# Patient Record
Sex: Female | Born: 1995 | Race: Black or African American | Hispanic: No | Marital: Single | Smoking: Never smoker
Health system: Southern US, Community
[De-identification: ages and names within clinical notes are randomized; demographics above are authoritative.]

## PROBLEM LIST (undated history)

## (undated) ENCOUNTER — Inpatient Hospital Stay (HOSPITAL_COMMUNITY): Payer: Self-pay

## (undated) DIAGNOSIS — R7989 Other specified abnormal findings of blood chemistry: Secondary | ICD-10-CM

## (undated) DIAGNOSIS — J45909 Unspecified asthma, uncomplicated: Secondary | ICD-10-CM

## (undated) HISTORY — DX: Unspecified asthma, uncomplicated: J45.909

## (undated) HISTORY — PX: INSERTION OF IMPLANON ROD: OBO 1005

## (undated) HISTORY — PX: REMOVAL OF IMPLANON ROD: OBO 1006

## (undated) HISTORY — PX: WISDOM TOOTH EXTRACTION: SHX21

## (undated) HISTORY — PX: DILATION AND EVACUATION: SHX1459

---
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
## 2018-07-27 NOTE — L&D Delivery Note (Addendum)
OB/GYN Faculty Practice Delivery Note  Deborah Rice is a 23 y.o. [id number] s/p uncomplicated at [redacted]w[redacted]d. She was admitted for active labor.  ROM: AROM with clear fluid immediately prior to delivery GBS Status: negative Maximum Maternal Temperature: 98.22F  Delivery Date/Time: 03/01/2019 @2040  Delivery: Called to room and patient was complete and pushing. Head delivered LOA. Loose nuchal cord present x1, delivered through. Shoulder and body delivered in usual fashion. Infant with spontaneous cry, placed on mother's abdomen, dried and stimulated. Cord clamped x 2 after 1-minute delay, and cut by FOB. Cord blood drawn. Placenta delivered spontaneously with gentle cord traction. Fundus firm with massage and Pitocin. Labia, perineum, vagina, and cervix inspected inspected with no lacerations noted.   Placenta: spontaneous, intact, 3v cord Complications: none Lacerations: none EBL: 100cc, QBL pending Analgesia: none  Postpartum Planning [x]  message to sent to schedule follow-up  [x]  vaccines UTD [ ]  post partum IUD and inpt circ  Infant: vigorous female  APGARs 9/9  weight pending  Corliss Blacker, PGY-III Family Medicine   Attestation:  I confirm that I have verified the information documented in the resident's note and that I have also personally reperformed the physical exam and all medical decision making activities.   I was gloved and present for entire delivery SVD without incident No difficulty with shoulders No lacerations  Seabron Spates, CNM  Please schedule this patient for Postpartum visit in: 4 weeks with the following provider: Any provider For C/S patients schedule nurse incision check in weeks 2 weeks: no Low risk pregnancy complicated by: none Delivery mode:  SVD Anticipated Birth Control:  BTL done PP PP Procedures needed: Recheck TSH  Schedule Integrated [place] visit: no

## 2018-08-09 ENCOUNTER — Other Ambulatory Visit (INDEPENDENT_AMBULATORY_CARE_PROVIDER_SITE_OTHER): Payer: Medicaid Other

## 2018-08-09 VITALS — BP 123/70 | HR 101

## 2018-08-09 DIAGNOSIS — Z3201 Encounter for pregnancy test, result positive: Secondary | ICD-10-CM

## 2018-08-09 LAB — POCT URINE PREGNANCY: Preg Test, Ur: POSITIVE — AB

## 2018-08-09 NOTE — Progress Notes (Signed)
Pt here today for UPT. Had a positive UPT at home. LMP 05/28/18. Pt denies any complaints at this time.   UPT today is positive. Per LMP pt is 10 weeks 2 days. Has new OB appointment on 08/23/2018 and is already taking PNV.   Crosby Oyster, RN

## 2018-08-10 NOTE — Progress Notes (Signed)
I have reviewed the chart and agree with nursing staff's documentation of this patient's encounter.  Aletha Halim, MD 08/10/2018 9:54 AM

## 2018-08-17 ENCOUNTER — Encounter: Payer: Self-pay | Admitting: Radiology

## 2018-08-23 ENCOUNTER — Ambulatory Visit (INDEPENDENT_AMBULATORY_CARE_PROVIDER_SITE_OTHER): Payer: Medicaid Other | Admitting: Obstetrics and Gynecology

## 2018-08-23 ENCOUNTER — Encounter: Payer: Self-pay | Admitting: Obstetrics and Gynecology

## 2018-08-23 DIAGNOSIS — Z3481 Encounter for supervision of other normal pregnancy, first trimester: Secondary | ICD-10-CM | POA: Diagnosis not present

## 2018-08-23 DIAGNOSIS — Z3492 Encounter for supervision of normal pregnancy, unspecified, second trimester: Secondary | ICD-10-CM

## 2018-08-23 DIAGNOSIS — O99210 Obesity complicating pregnancy, unspecified trimester: Secondary | ICD-10-CM | POA: Insufficient documentation

## 2018-08-23 DIAGNOSIS — Z3401 Encounter for supervision of normal first pregnancy, first trimester: Secondary | ICD-10-CM

## 2018-08-23 DIAGNOSIS — Z3480 Encounter for supervision of other normal pregnancy, unspecified trimester: Secondary | ICD-10-CM | POA: Insufficient documentation

## 2018-08-23 DIAGNOSIS — O99211 Obesity complicating pregnancy, first trimester: Secondary | ICD-10-CM

## 2018-08-23 DIAGNOSIS — E669 Obesity, unspecified: Secondary | ICD-10-CM | POA: Insufficient documentation

## 2018-08-23 NOTE — Patient Instructions (Addendum)
Your Due date is August 8th  Today you are 12 weeks and 3 days

## 2018-08-23 NOTE — Progress Notes (Signed)
Last pap 03/2017  Decline flu injection

## 2018-08-23 NOTE — Progress Notes (Signed)
New OB Note  08/23/2018   Clinic: [facility]  Chief Complaint: NOB  Transfer of Care Patient: no  History of Present Illness: Deborah Rice is a 23 y.o. G2P1001 @ 12/3 weeks (EDC LMP=12wk in office u/s today. Patient's last menstrual period was 05/28/2018 (exact date).  Preg complicated by has Supervision of normal pregnancy in first trimester; BMI 30s; and Obesity in pregnancy on their problem list.   Any events prior to today's visit: no Her periods were: qmonth, regular She was using no method when she conceived.  She has Negative signs or symptoms of nausea/vomiting of pregnancy. She has Negative signs or symptoms of miscarriage or preterm labor On any medications around the time she conceived/early pregnancy: No   ROS: A 12-point review of systems was performed and negative, except as stated in the above HPI.  OBGYN History: As per HPI. OB History  Gravida Para Term Preterm AB Living  2 1 1     1   SAB TAB Ectopic Multiple Live Births          1    # Outcome Date GA Lbr Len/2nd Weight Sex Delivery Anes PTL Lv  2 Current           1 Term 07/28/15   7 lb (3.175 kg) M Vag-Spont  N LIV    Any issues with any prior pregnancies: no Prior children are healthy, doing well, and without any problems or issues: yes History of pap smears: Yes. Last pap smear 2018 and results were NILM    Past Medical History: Past Medical History:  Diagnosis Date  . Asthma     Past Surgical History: Past Surgical History:  Procedure Laterality Date  . WISDOM TOOTH EXTRACTION  2014    Family History:  Family History  Problem Relation Age of Onset  . Asthma Sister   . Asthma Brother   . Diabetes Maternal Grandmother     She denies any history of mental retardation, birth defects or genetic disorders in her or the FOB's history  Social History:  Social History   Socioeconomic History  . Marital status: Single    Spouse name: Not on file  . Number of  children: Not on file  . Years of education: Not on file  . Highest education level: Not on file  Occupational History  . Not on file  Social Needs  . Financial resource strain: Not on file  . Food insecurity:    Worry: Not on file    Inability: Not on file  . Transportation needs:    Medical: Not on file    Non-medical: Not on file  Tobacco Use  . Smoking status: Never Smoker  . Smokeless tobacco: Never Used  Substance and Sexual Activity  . Alcohol use: Not Currently  . Drug use: Not Currently  . Sexual activity: Yes  Lifestyle  . Physical activity:    Days per week: Not on file    Minutes per session: Not on file  . Stress: Not on file  Relationships  . Social connections:    Talks on phone: Not on file    Gets together: Not on file    Attends religious service: Not on file    Active member of club or organization: Not on file    Attends meetings of clubs or organizations: Not on file    Relationship status: Not on file  . Intimate partner violence:    Fear of current or ex  partner: Not on file    Emotionally abused: Not on file    Physically abused: Not on file    Forced sexual activity: Not on file  Other Topics Concern  . Not on file  Social History Narrative  . Not on file     Allergy: Not on File  Health Maintenance:  Mammogram Up to Date: not applicable  Current Outpatient Medications: PNV  Physical Exam:   BP (!) 104/58   Pulse 77   Ht 5\' 3"  (1.6 m)   Wt 193 lb 3.2 oz (87.6 kg)   LMP 05/28/2018 (Exact Date)   BMI 34.22 kg/m  Body mass index is 34.22 kg/m.    Marland Kitchen Fundal height: not applicable FHTs: 903E  General appearance: Well nourished, well developed female in no acute distress.  Neck:  Supple, normal appearance, and no thyromegaly  Cardiovascular: S1, S2 normal, no murmur, rub or gallop, regular rate and rhythm Respiratory:  Clear to auscultation bilateral. Normal respiratory effort Abdomen: positive bowel sounds and no masses,  hernias; diffusely non tender to palpation, non distended Breasts: pt denies any breast s/s Neuro/Psych:  Normal mood and affect.  Skin:  Warm and dry.   Laboratory: none  Imaging:  Bedside u/s with SLIUP at 11/6 based on CRL, FHR 160s, with subj normal AF and +FM  Assessment: pt doing well  Plan: 1. Encounter for supervision of normal first pregnancy in first trimester Offer afp and schedule anatomy u/s next visit.  - Obstetric Panel, Including HIV - Inheritest(R) CF/SMA Panel - Culture, OB Urine - Hemoglobinopathy evaluation - Genetic Screening - Enroll Patient in Babyscripts - TSH - Hemoglobin A1c - Comprehensive metabolic panel - Protein / creatinine ratio, urine  2. BMI 30s  3. Obesity in pregnancy  Problem list reviewed and updated.  Follow up in 3 weeks.  The nature of [place] with multiple MDs and other Advanced Practice Providers was explained to patient; also emphasized that residents, students are part of our team.  >50% of 25 min visit spent on counseling and coordination of care.     Durene Romans MD Attending [facility])

## 2018-08-24 LAB — PROTEIN / CREATININE RATIO, URINE
Creatinine, Urine: 393.2 mg/dL
Protein, Ur: 62.4 mg/dL
Protein/Creat Ratio: 159 mg/g creat (ref 0–200)

## 2018-08-25 LAB — URINE CULTURE, OB REFLEX: ORGANISM ID, BACTERIA: NO GROWTH

## 2018-08-25 LAB — CULTURE, OB URINE

## 2018-08-29 ENCOUNTER — Encounter: Payer: Self-pay | Admitting: Radiology

## 2018-08-30 ENCOUNTER — Other Ambulatory Visit: Payer: Self-pay | Admitting: *Deleted

## 2018-08-30 MED ORDER — PREPLUS 27-1 MG PO TABS: 1.0000 | ORAL_TABLET | Freq: Every day | ORAL | 13 refills | Status: DC

## 2018-09-02 ENCOUNTER — Encounter: Payer: Self-pay | Admitting: Radiology

## 2018-09-05 LAB — OBSTETRIC PANEL, INCLUDING HIV
Antibody Screen: NEGATIVE
Basophils Absolute: 0 10*3/uL (ref 0.0–0.2)
Basos: 0 %
EOS (ABSOLUTE): 0 10*3/uL (ref 0.0–0.4)
Eos: 0 %
HIV Screen 4th Generation wRfx: NONREACTIVE
Hematocrit: 36.3 % (ref 34.0–46.6)
Hemoglobin: 12.5 g/dL (ref 11.1–15.9)
Hepatitis B Surface Ag: NEGATIVE
Immature Grans (Abs): 0 10*3/uL (ref 0.0–0.1)
Immature Granulocytes: 0 %
Lymphocytes Absolute: 1.7 10*3/uL (ref 0.7–3.1)
Lymphs: 19 %
MCH: 28.5 pg (ref 26.6–33.0)
MCHC: 34.4 g/dL (ref 31.5–35.7)
MCV: 83 fL (ref 79–97)
Monocytes Absolute: 0.5 10*3/uL (ref 0.1–0.9)
Monocytes: 5 %
Neutrophils Absolute: 6.5 10*3/uL (ref 1.4–7.0)
Neutrophils: 76 %
Platelets: 238 10*3/uL (ref 150–450)
RBC: 4.38 x10E6/uL (ref 3.77–5.28)
RDW: 13.3 % (ref 11.7–15.4)
RPR Ser Ql: NONREACTIVE
Rh Factor: POSITIVE
Rubella Antibodies, IGG: 2.04 index (ref >0.99)
WBC: 8.8 10*3/uL (ref 3.4–10.8)

## 2018-09-05 LAB — HEMOGLOBINOPATHY EVALUATION
HGB A: 97.6 % (ref 96.4–98.8)
HGB C: 0 %
HGB S: 0 %
HGB VARIANT: 0 %
Hemoglobin A2 Quantitation: 2.4 % (ref 1.8–3.2)
Hemoglobin F Quantitation: 0 % (ref 0.0–2.0)

## 2018-09-05 LAB — INHERITEST(R) CF/SMA PANEL

## 2018-09-05 LAB — COMPREHENSIVE METABOLIC PANEL
ALT: 10 IU/L (ref 0–32)
AST: 10 IU/L (ref 0–40)
Albumin/Globulin Ratio: 1.8 (ref 1.2–2.2)
Albumin: 4.2 g/dL (ref 3.9–5.0)
Alkaline Phosphatase: 48 IU/L (ref 39–117)
BUN/Creatinine Ratio: 11 (ref 9–23)
BUN: 7 mg/dL (ref 6–20)
Bilirubin Total: 0.5 mg/dL (ref 0.0–1.2)
CALCIUM: 9.6 mg/dL (ref 8.7–10.2)
CO2: 18 mmol/L — AB (ref 20–29)
Chloride: 105 mmol/L (ref 96–106)
Creatinine, Ser: 0.63 mg/dL (ref 0.57–1.00)
GFR calc Af Amer: 146 mL/min/{1.73_m2} (ref >59)
GFR calc non Af Amer: 127 mL/min/{1.73_m2} (ref >59)
Globulin, Total: 2.3 g/dL (ref 1.5–4.5)
Glucose: 93 mg/dL (ref 65–99)
Potassium: 3.9 mmol/L (ref 3.5–5.2)
Sodium: 138 mmol/L (ref 134–144)
Total Protein: 6.5 g/dL (ref 6.0–8.5)

## 2018-09-05 LAB — HEMOGLOBIN A1C
Est. average glucose Bld gHb Est-mCnc: 94 mg/dL
Hgb A1c MFr Bld: 4.9 % (ref 4.8–5.6)

## 2018-09-05 LAB — TSH: TSH: 0.426 u[IU]/mL — ABNORMAL LOW (ref 0.450–4.500)

## 2018-09-10 ENCOUNTER — Encounter: Payer: Self-pay | Admitting: Obstetrics and Gynecology

## 2018-09-10 DIAGNOSIS — R7989 Other specified abnormal findings of blood chemistry: Secondary | ICD-10-CM

## 2018-09-10 HISTORY — DX: Other specified abnormal findings of blood chemistry: R79.89

## 2018-09-13 ENCOUNTER — Other Ambulatory Visit (HOSPITAL_COMMUNITY)
Admission: RE | Admit: 2018-09-13 | Discharge: 2018-09-13 | Disposition: A | Discharge status: Home / Self Care | Payer: Medicaid Other | Source: Ambulatory Visit | Attending: Obstetrics & Gynecology | Admitting: Obstetrics & Gynecology

## 2018-09-13 ENCOUNTER — Ambulatory Visit (INDEPENDENT_AMBULATORY_CARE_PROVIDER_SITE_OTHER): Payer: Medicaid Other | Admitting: Obstetrics & Gynecology

## 2018-09-13 VITALS — BP 111/72 | HR 72 | Wt 193.0 lb

## 2018-09-13 DIAGNOSIS — O4692 Antepartum hemorrhage, unspecified, second trimester: Secondary | ICD-10-CM

## 2018-09-13 DIAGNOSIS — R7989 Other specified abnormal findings of blood chemistry: Secondary | ICD-10-CM

## 2018-09-13 DIAGNOSIS — Z3482 Encounter for supervision of other normal pregnancy, second trimester: Secondary | ICD-10-CM

## 2018-09-13 DIAGNOSIS — Z3689 Encounter for other specified antenatal screening: Secondary | ICD-10-CM

## 2018-09-13 DIAGNOSIS — N760 Acute vaginitis: Secondary | ICD-10-CM

## 2018-09-13 DIAGNOSIS — Z3A15 15 weeks gestation of pregnancy: Secondary | ICD-10-CM

## 2018-09-13 DIAGNOSIS — O23592 Infection of other part of genital tract in pregnancy, second trimester: Secondary | ICD-10-CM

## 2018-09-13 NOTE — Progress Notes (Signed)
   PRENATAL VISIT NOTE  Subjective:  Deborah Rice is a 23 y.o. G2P1001 at [redacted]w[redacted]d being seen today for ongoing prenatal care.  She is currently monitored for the following issues for this low-risk pregnancy and has Supervision of normal pregnancy in second trimester; BMI 30s; Obesity in pregnancy; and Low TSH level on their problem list.  Patient reports episodes of vaginal spotting. Not related to intercourse or other activity. No previous ultrasounds.  Contractions: Not present. Vag. Bleeding: None.   . Denies leaking of fluid.   The following portions of the patient's history were reviewed and updated as appropriate: allergies, current medications, past family history, past medical history, past social history, past surgical history and problem list. Problem list updated.  Objective:   Vitals:   09/13/18 1313  BP: 111/72  Pulse: 72  Weight: 193 lb (87.5 kg)    Fetal Status: Fetal Heart Rate (bpm): 150         General:  Alert, oriented and cooperative. Patient is in no acute distress.  Skin: Skin is warm and dry. No rash noted.   Cardiovascular: Normal heart rate noted  Respiratory: Normal respiratory effort, no problems with respiration noted  Abdomen: Soft, gravid, appropriate for gestational age.  Pain/Pressure: Present     Pelvic: Cervical exam performed Dilation: Closed Effacement (%): Thick Station: Ballotable. White discharge on exam, testing sample obtained.  Extremities: Normal range of motion.     Mental Status: Normal mood and affect. Normal behavior. Normal judgment and thought content.   Assessment and Plan:  Pregnancy: G2P1001 at [redacted]w[redacted]d  1. Vaginal bleeding in pregnancy, second trimester Unremarkable exam findings. Given second trimester bleeding, will evaluate for placental (SCH/previa) or cervical anomaly. Bleeding precautions reviewed.  - [place] MFM OB LIMITED; Future - [place] MFM OB TRANSVAGINAL; Future - Cervicovaginal ancillary only  2. Low TSH level TSH was 0.426  on 08/23/18, no history of thyroid dysfunction - T4, free - T3, free  3. Encounter for fetal anatomic survey Anatomy scan ordered.  - [place] MFM OB COMP + 14 WK; Future  4. Encounter for supervision of other normal pregnancy in second trimester Had low risk NIPS, AFP to be done today.  - AFP, Serum, Open Spina Bifida No other complaints or concerns.  Routine obstetric precautions reviewed. Please refer to After Visit Summary for other counseling recommendations.  Return in about 4 weeks (around 10/11/2018) for OB Visit with CNM Jeronimo Greaves).  Future Appointments  Date Time Provider [place]  09/14/2018 10:00 AM WH-MFC [place] 3 WH-MFCUS MFC-US  10/12/2018 10:00 AM WH-MFC [place] 3 WH-MFCUS MFC-US    Verita Schneiders, MD

## 2018-09-13 NOTE — Patient Instructions (Signed)
Considering Waterbirth? Guide for patients at [facility] [employer] [facility]  Why consider waterbirth?  . Gentle birth for babies . Less pain medicine used in labor . May allow for passive descent/less pushing . May reduce perineal tears  . More mobility and instinctive maternal position changes . Increased maternal relaxation . Reduced blood pressure in labor  Is waterbirth safe? What are the risks of infection, drowning or other complications?  . Infection: o Very low risk (3.7 % for tub vs 4.8% for bed) o 7 in 8000 waterbirths with documented infection o Poorly cleaned equipment most common cause o Slightly lower group B strep transmission rate  . Drowning o Maternal:  - Very low risk   - Related to seizures or fainting o Newborn:  - Very low risk. No evidence of increased risk of respiratory problems in multiple large studies - Physiological protection from breathing under water - Avoid underwater birth if there are any fetal complications - Once baby's head is out of the water, keep it out.  . Birth complication o Some reports of cord trauma, but risk decreased by bringing baby to surface gradually o No evidence of increased risk of shoulder dystocia. Mothers can usually change positions faster in water than in a bed, possibly aiding the maneuvers to free the shoulder.   You must attend a Doren Custard class at [facility]  3rd Wednesday of every month from 7-9pm  [employer] by calling [phone] or online at [email or web address]  Bring [place] the certificate from the class to your prenatal appointment  Meet with a midwife at 36 weeks to see if you can still plan a waterbirth and to sign the consent.   If you plan a waterbirth after September 18, 2018, at [facility], you will need to purchase the following:  Fish net  Bathing suit top (optional)  Long-handled mirror (optional)  If you plan a waterbirth before  September 18, 2018: Purchase or rent the following supplies: You are responsible for providing all supplies listed above. **If you do not have all necessary supplies you cannot have a waterbirth.**   Water Birth Pool (Birth Pool in a Box or [place] for instance)  (Tubs start ~$125)  Single-use disposable tub liner designed for your brand of tub  Electric drain pump to remove water (We recommend 792 gallon per hour or greater pump.)   New garden hose labeled "lead-free", "suitable for drinking water",  Separate garden hose to remove the dirty water  Fish net  Bathing suit top (optional)  Long-handled mirror (optional)  Places to purchase or rent supplies:   [employer] for tub purchases and supplies  [employer] for tub purchases and supplies  [employer] (www.thelaborladies.com) $275 for tub rental/set-up & take down/kit   [employer] (http://www.fleming.com/.htm) Information regarding doulas (labor support) who provide pool rentals  Things that would prevent you from having a waterbirth:  Premature, <37wks  Previous cesarean birth  Presence of thick meconium-stained fluid  Multiple gestation (Twins, triplets, etc.)  Uncontrolled diabetes or gestational diabetes requiring medication  Hypertension requiring medication or diagnosis of pre-eclampsia  Heavy vaginal bleeding  Non-reassuring fetal heart rate  Active infection (MRSA, etc.). Group B Strep is NOT a contraindication for waterbirth.  If your labor has to be induced and induction method requires continuous monitoring of the baby's heart rate  Other risks/issues identified by your obstetrical provider  Please remember that birth is unpredictable. Under certain unforeseeable circumstances your  provider may advise against giving birth in the tub. These decisions will be made on a case-by-case basis and with the safety of you and your baby as our highest  priority.    

## 2018-09-14 ENCOUNTER — Ambulatory Visit (HOSPITAL_COMMUNITY)
Admission: RE | Admit: 2018-09-14 | Discharge: 2018-09-14 | Disposition: A | Discharge status: Home / Self Care | Payer: Medicaid Other | Source: Ambulatory Visit | Attending: Obstetrics & Gynecology | Admitting: Obstetrics & Gynecology

## 2018-09-14 DIAGNOSIS — O4692 Antepartum hemorrhage, unspecified, second trimester: Secondary | ICD-10-CM | POA: Diagnosis not present

## 2018-09-14 DIAGNOSIS — Z3A15 15 weeks gestation of pregnancy: Secondary | ICD-10-CM | POA: Diagnosis not present

## 2018-09-14 LAB — CERVICOVAGINAL ANCILLARY ONLY
Bacterial vaginitis: NEGATIVE
CHLAMYDIA, DNA PROBE: NEGATIVE
Candida vaginitis: POSITIVE — AB
Neisseria Gonorrhea: NEGATIVE
Trichomonas: NEGATIVE

## 2018-09-14 LAB — T3, FREE: T3, Free: 2.9 pg/mL (ref 2.0–4.4)

## 2018-09-14 LAB — T4, FREE: Free T4: 1.56 ng/dL (ref 0.82–1.77)

## 2018-09-14 MED ORDER — TERCONAZOLE 0.8 % VA CREA: 1.0000 | TOPICAL_CREAM | Freq: Every day | VAGINAL | 0 refills | Status: DC

## 2018-09-14 NOTE — Addendum Note (Signed)
Addended by: Verita Schneiders A on: 09/14/2018 04:56 PM   Modules accepted: Orders

## 2018-09-15 LAB — AFP, SERUM, OPEN SPINA BIFIDA
AFP MoM: 0.52
AFP VALUE AFPOSL: 14.9 ng/mL
Gest. Age on Collection Date: 15.4 weeks
Maternal Age At EDD: 23.9 yr
OSBR Risk 1 IN: 10000
Test Results:: NEGATIVE
WEIGHT: 193 [lb_av]

## 2018-09-16 ENCOUNTER — Encounter: Payer: Self-pay | Admitting: Obstetrics & Gynecology

## 2018-09-16 DIAGNOSIS — O4692 Antepartum hemorrhage, unspecified, second trimester: Secondary | ICD-10-CM | POA: Insufficient documentation

## 2018-10-12 ENCOUNTER — Ambulatory Visit (INDEPENDENT_AMBULATORY_CARE_PROVIDER_SITE_OTHER): Payer: Medicaid Other | Admitting: Advanced Practice Midwife

## 2018-10-12 ENCOUNTER — Other Ambulatory Visit: Payer: Self-pay

## 2018-10-12 ENCOUNTER — Ambulatory Visit (HOSPITAL_COMMUNITY)
Admission: RE | Admit: 2018-10-12 | Discharge: 2018-10-12 | Disposition: A | Discharge status: Home / Self Care | Payer: Medicaid Other | Source: Ambulatory Visit | Attending: Obstetrics & Gynecology | Admitting: Obstetrics & Gynecology

## 2018-10-12 VITALS — BP 106/69 | HR 59 | Wt 193.0 lb

## 2018-10-12 DIAGNOSIS — O99210 Obesity complicating pregnancy, unspecified trimester: Secondary | ICD-10-CM

## 2018-10-12 DIAGNOSIS — Z363 Encounter for antenatal screening for malformations: Secondary | ICD-10-CM

## 2018-10-12 DIAGNOSIS — Z3A19 19 weeks gestation of pregnancy: Secondary | ICD-10-CM | POA: Diagnosis not present

## 2018-10-12 DIAGNOSIS — O99212 Obesity complicating pregnancy, second trimester: Secondary | ICD-10-CM

## 2018-10-12 DIAGNOSIS — Z3482 Encounter for supervision of other normal pregnancy, second trimester: Secondary | ICD-10-CM

## 2018-10-12 DIAGNOSIS — Z3689 Encounter for other specified antenatal screening: Secondary | ICD-10-CM | POA: Diagnosis not present

## 2018-10-12 DIAGNOSIS — O4692 Antepartum hemorrhage, unspecified, second trimester: Secondary | ICD-10-CM

## 2018-10-12 NOTE — Patient Instructions (Signed)

## 2018-10-12 NOTE — Progress Notes (Signed)
   PRENATAL VISIT NOTE  Subjective:  Deborah Rice is a 23 y.o. G2P1001 at [redacted]w[redacted]d being seen today for ongoing prenatal care.  She is currently monitored for the following issues for this low-risk pregnancy and has Supervision of normal pregnancy in second trimester; BMI 30s; Obesity in pregnancy; Low TSH level; and Vaginal bleeding in pregnancy, second trimester on their problem list.  Patient reports no complaints. Her previous vaginal spotting resolved "almost right after" her previous visit without intervention.  Contractions: Not present. Vag. Bleeding: None.  Movement: Present. Denies leaking of fluid.   The following portions of the patient's history were reviewed and updated as appropriate: allergies, current medications, past family history, past medical history, past social history, past surgical history and problem list. Problem list updated.  Objective:   Vitals:   10/12/18 1312  BP: 106/69  Pulse: (!) 59  Weight: 193 lb (87.5 kg)    Fetal Status: Fetal Heart Rate (bpm): 152   Movement: Present     General:  Alert, oriented and cooperative. Patient is in no acute distress.  Skin: Skin is warm and dry. No rash noted.   Cardiovascular: Normal heart rate noted  Respiratory: Normal respiratory effort, no problems with respiration noted  Abdomen: Soft, gravid, appropriate for gestational age.  Pain/Pressure: Present     Pelvic: Cervical exam deferred        Extremities: Normal range of motion.  Edema: None  Mental Status: Normal mood and affect. Normal behavior. Normal judgment and thought content.   Assessment and Plan:  Pregnancy: G2P1001 at [redacted]w[redacted]d  1. Encounter for supervision of other normal pregnancy in second trimester --No complaints or concerns  2. Vaginal bleeding in pregnancy, second trimester --S/p normal ultrasound this morning --No bleeding since previous visit  3. Obesity in pregnancy --Weight stable  Preterm labor symptoms and general obstetric  precautions including but not limited to vaginal bleeding, contractions, leaking of fluid and fetal movement were reviewed in detail with the patient. Please refer to After Visit Summary for other counseling recommendations.  Return in about 4 weeks (around 11/09/2018).  Future Appointments  Date Time Provider [place]  10/26/2018  1:15 PM Caren Macadam, MD [facility]    Darlina Rumpf, [place]

## 2018-10-26 ENCOUNTER — Encounter: Payer: Self-pay | Admitting: Family Medicine

## 2018-10-26 ENCOUNTER — Other Ambulatory Visit: Payer: Self-pay

## 2018-10-26 ENCOUNTER — Ambulatory Visit (INDEPENDENT_AMBULATORY_CARE_PROVIDER_SITE_OTHER): Payer: Medicaid Other | Admitting: Family Medicine

## 2018-10-26 ENCOUNTER — Other Ambulatory Visit (HOSPITAL_COMMUNITY)
Admission: RE | Admit: 2018-10-26 | Discharge: 2018-10-26 | Disposition: A | Discharge status: Home / Self Care | Payer: Medicaid Other | Source: Ambulatory Visit | Attending: Family Medicine | Admitting: Family Medicine

## 2018-10-26 VITALS — BP 124/75 | HR 101 | Wt 196.0 lb

## 2018-10-26 DIAGNOSIS — N898 Other specified noninflammatory disorders of vagina: Secondary | ICD-10-CM | POA: Diagnosis not present

## 2018-10-26 DIAGNOSIS — O26892 Other specified pregnancy related conditions, second trimester: Secondary | ICD-10-CM | POA: Diagnosis not present

## 2018-10-26 DIAGNOSIS — Z3482 Encounter for supervision of other normal pregnancy, second trimester: Secondary | ICD-10-CM

## 2018-10-26 DIAGNOSIS — Z3A21 21 weeks gestation of pregnancy: Secondary | ICD-10-CM

## 2018-10-26 DIAGNOSIS — O99210 Obesity complicating pregnancy, unspecified trimester: Secondary | ICD-10-CM

## 2018-10-26 DIAGNOSIS — O99212 Obesity complicating pregnancy, second trimester: Secondary | ICD-10-CM

## 2018-10-26 DIAGNOSIS — O4692 Antepartum hemorrhage, unspecified, second trimester: Secondary | ICD-10-CM

## 2018-10-26 NOTE — Progress Notes (Signed)
   PRENATAL VISIT NOTE  Subjective:  Deborah Rice is a 23 y.o. G2P1001 at [redacted]w[redacted]d being seen today for ongoing prenatal care.  She is currently monitored for the following issues for this low-risk pregnancy and has Supervision of normal pregnancy in second trimester; BMI 30s; Obesity in pregnancy; Low TSH level; and Vaginal bleeding in pregnancy, second trimester on their problem list.  Patient reports see below.  Contractions: Irritability. Vag. Bleeding: Other.  Movement: (!) Decreased. Denies leaking of fluid.    Yesterday she reports like she felt like something was coming out. Report thick/yellow green mucous. She thought it looked like a mucous plug which she remembers coming out at ~29 weks with her last pregnancy.  Reports associated lower abdominal pain. No itching/ burning. Occurred yesterday. Since that time reports pain located in LLQ, intermittently every 4 x per day, more intense. Denies neuropathic sx.  Reports last BM was this morning.   The following portions of the patient's history were reviewed and updated as appropriate: allergies, current medications, past family history, past medical history, past social history, past surgical history and problem list.   Objective:   Vitals:   10/26/18 1426  BP: 124/75  Pulse: (!) 101  Weight: 196 lb (88.9 kg)    Fetal Status: Fetal Heart Rate (bpm): 150   Movement: (!) Decreased     General:  Alert, oriented and cooperative. Patient is in no acute distress.  Skin: Skin is warm and dry. No rash noted.   Cardiovascular: Normal heart rate noted  Respiratory: Normal respiratory effort, no problems with respiration noted  Abdomen: Soft, gravid, appropriate for gestational age.  Pain/Pressure: Present     Pelvic: Cervical exam performed Dilation: Closed Effacement (%): Thick  + thickwhite/yellow discharge. No pooling.   Extremities: Normal range of motion.  Edema: None  Mental Status: Normal mood and affect. Normal behavior. Normal  judgment and thought content.   Assessment and Plan:  Pregnancy: G2P1001 at [redacted]w[redacted]d  1. Encounter for supervision of other normal pregnancy in second trimester Reassuring exam today- nitrazine negative, not concerned for ROM. Cervix is visually closed and closed on exam.  Counseled to seek care if cramping continued or has bleeding. Most likely normal discharge with ligament (round vs broad) pain. She voiced understanding.   2. Obesity in pregnancy TWG= -24 lb (-10.9 kg)   3. Vaginal bleeding in pregnancy, second trimester None reported today  4. Vaginal discharge during pregnancy in second trimester - Cervicovaginal ancillary only   Preterm labor symptoms and general obstetric precautions including but not limited to vaginal bleeding, contractions, leaking of fluid and fetal movement were reviewed in detail with the patient. Please refer to After Visit Summary for other counseling recommendations.   Return in about 4 weeks (around 11/23/2018) for Routine prenatal care *VIRTUAL*.  Future Appointments  Date Time Provider [place]  11/23/2018  1:30 PM Caren Macadam, MD [facility]    Caren Macadam, MD

## 2018-10-28 LAB — CERVICOVAGINAL ANCILLARY ONLY
Bacterial vaginitis: NEGATIVE
Candida vaginitis: POSITIVE — AB
Chlamydia: NEGATIVE
Neisseria Gonorrhea: NEGATIVE
Trichomonas: NEGATIVE

## 2018-10-31 ENCOUNTER — Other Ambulatory Visit: Payer: Self-pay

## 2018-10-31 ENCOUNTER — Telehealth: Payer: Self-pay

## 2018-10-31 MED ORDER — FLUCONAZOLE 150 MG PO TABS: 150.0000 mg | ORAL_TABLET | Freq: Once | ORAL | 0 refills | Status: AC

## 2018-10-31 NOTE — Telephone Encounter (Signed)
Called in diflucan 150 mg  for patient who tested positive for yeast.

## 2018-10-31 NOTE — Telephone Encounter (Signed)
-----   Message from Caren Macadam, MD sent at 10/31/2018 10:09 AM EDT ----- Positive for yeast. Please call patient and see if she is still having the thick discharge and if so treat for yeast per the standing order. Thanks!

## 2018-10-31 NOTE — Telephone Encounter (Signed)
Patient tested positive for yeast infection. I have called her to inform her of test results and the need to be treated if she is still having discharge. LMCTB

## 2018-11-08 ENCOUNTER — Encounter: Payer: Self-pay | Admitting: *Deleted

## 2018-11-14 ENCOUNTER — Encounter: Payer: Self-pay | Admitting: *Deleted

## 2018-11-14 ENCOUNTER — Telehealth: Payer: Self-pay | Admitting: *Deleted

## 2018-11-14 NOTE — Telephone Encounter (Signed)
Left message for pt to call back to reschedule her appointment.

## 2018-11-22 ENCOUNTER — Other Ambulatory Visit: Payer: Self-pay

## 2018-11-22 ENCOUNTER — Ambulatory Visit (INDEPENDENT_AMBULATORY_CARE_PROVIDER_SITE_OTHER): Payer: Medicaid Other | Admitting: Family Medicine

## 2018-11-22 VITALS — BP 116/72 | Wt 200.0 lb

## 2018-11-22 DIAGNOSIS — K219 Gastro-esophageal reflux disease without esophagitis: Secondary | ICD-10-CM

## 2018-11-22 DIAGNOSIS — Z3A25 25 weeks gestation of pregnancy: Secondary | ICD-10-CM

## 2018-11-22 DIAGNOSIS — Z3482 Encounter for supervision of other normal pregnancy, second trimester: Secondary | ICD-10-CM

## 2018-11-22 MED ORDER — OMEPRAZOLE MAGNESIUM 20 MG PO TBEC: 20.0000 mg | DELAYED_RELEASE_TABLET | Freq: Every day | ORAL | 3 refills | Status: DC

## 2018-11-22 NOTE — Patient Instructions (Signed)

## 2018-11-22 NOTE — Progress Notes (Signed)
   PRENATAL VISIT NOTE TELEHEALTH VIRTUAL OBSTETRICS VISIT ENCOUNTER NOTE  I connected with@ on 11/22/18 at  2:15 PM EDT by Webex at home and verified that I am speaking with the correct person using two identifiers.   I discussed the limitations, risks, security and privacy concerns of performing an evaluation and management service by telephone and the availability of in person appointments. I also discussed with the patient that there may be a patient responsible charge related to this service. The patient expressed understanding and agreed to proceed. Subjective:  Deborah Rice is a 23 y.o. G2P1001 at [redacted]w[redacted]d being seen today for ongoing prenatal care.  She is currently monitored for the following issues for this low-risk pregnancy and has Supervision of normal pregnancy in second trimester; BMI 30s; Obesity in pregnancy; Low TSH level; and Vaginal bleeding in pregnancy, second trimester on their problem list.  Patient reports heartburn.  Reports fetal movement. Contractions: Irritability. Vag. Bleeding: None.  Movement: Present. Denies any contractions, bleeding or leaking of fluid.   The following portions of the patient's history were reviewed and updated as appropriate: allergies, current medications, past family history, past medical history, past social history, past surgical history and problem list.   Objective:   Vitals:   11/22/18 1429  BP: 116/72  Weight: 200 lb (90.7 kg)    Fetal Status:     Movement: Present     General:  Alert, oriented and cooperative. Patient is in no acute distress.  Respiratory: Normal respiratory effort, no problems with respiration noted  Mental Status: Normal mood and affect. Normal behavior. Normal judgment and thought content.  Rest of physical exam deferred due to type of encounter  Assessment and Plan:  Pregnancy: G2P1001 at [redacted]w[redacted]d 1. Encounter for supervision of other normal pregnancy in second trimester Continue routine prenatal care.    2. Gastroesophageal reflux disease without esophagitis Trial of PPI  Preterm labor symptoms and general obstetric precautions including but not limited to vaginal bleeding, contractions, leaking of fluid and fetal movement were reviewed in detail with the patient. I discussed the assessment and treatment plan with the patient. The patient was provided an opportunity to ask questions and all were answered. The patient agreed with the plan and demonstrated an understanding of the instructions. The patient was advised to call back or seek an in-person office evaluation/go to [facility] at [facility] for any urgent or concerning symptoms. Please refer to After Visit Summary for other counseling recommendations.  I provided 10 minutes of non-face-to-face time during this encounter. Return in 3 weeks (on 12/13/2018) for 28 wk labs.  Future Appointments  Date Time Provider [place]  12/13/2018  8:30 AM Aletha Halim, MD [facility]    Donnamae Jude, [place] for [facility], [place]

## 2018-11-23 ENCOUNTER — Encounter: Payer: Medicaid Other | Admitting: Family Medicine

## 2018-12-13 ENCOUNTER — Other Ambulatory Visit: Payer: Self-pay

## 2018-12-13 ENCOUNTER — Ambulatory Visit (INDEPENDENT_AMBULATORY_CARE_PROVIDER_SITE_OTHER): Payer: Medicaid Other | Admitting: Obstetrics and Gynecology

## 2018-12-13 VITALS — BP 123/80 | HR 92 | Wt 208.5 lb

## 2018-12-13 DIAGNOSIS — Z3402 Encounter for supervision of normal first pregnancy, second trimester: Secondary | ICD-10-CM | POA: Diagnosis not present

## 2018-12-13 DIAGNOSIS — R7989 Other specified abnormal findings of blood chemistry: Secondary | ICD-10-CM | POA: Diagnosis not present

## 2018-12-13 DIAGNOSIS — Z23 Encounter for immunization: Secondary | ICD-10-CM

## 2018-12-13 NOTE — Progress Notes (Signed)
Prenatal Visit Note Date: 12/13/2018 Clinic: [facility]  Subjective:  Deborah Rice is a 23 y.o. G2P1001 at [redacted]w[redacted]d being seen today for ongoing prenatal care.  She is currently monitored for the following issues for this low-risk pregnancy and has Supervision of normal pregnancy in second trimester; BMI 30s; Obesity in pregnancy; and Low TSH level on their problem list.  Patient reports no complaints.   Contractions: Not present.  .  Movement: Present. Denies leaking of fluid.   The following portions of the patient's history were reviewed and updated as appropriate: allergies, current medications, past family history, past medical history, past social history, past surgical history and problem list. Problem list updated.  Objective:   Vitals:   12/13/18 0841  BP: 123/80  Pulse: 92  Weight: 208 lb 8 oz (94.6 kg)    Fetal Status: Fetal Heart Rate (bpm): 143 Fundal Height: 28 cm Movement: Present     General:  Alert, oriented and cooperative. Patient is in no acute distress.  Skin: Skin is warm and dry. No rash noted.   Cardiovascular: Normal heart rate noted  Respiratory: Normal respiratory effort, no problems with respiration noted  Abdomen: Soft, gravid, appropriate for gestational age. Pain/Pressure: Absent     Pelvic:  Cervical exam deferred        Extremities: Normal range of motion.  Edema: None  Mental Status: Normal mood and affect. Normal behavior. Normal judgment and thought content.   Urinalysis:      Assessment and Plan:  Pregnancy: G2P1001 at [redacted]w[redacted]d  1. Encounter for supervision of normal first pregnancy in second trimester Routine care - Glucose Tolerance, 2 Hours w/1 Hour - RPR - CBC - HIV Antibody (routine testing w rflx)  2. Low TSH level - Thyroid Panel With TSH  Preterm labor symptoms and general obstetric precautions including but not limited to vaginal bleeding, contractions, leaking of fluid and fetal movement were reviewed in  detail with the patient. Please refer to After Visit Summary for other counseling recommendations.  Return in about 3 weeks (around 01/03/2019) for virtual rob.   Aletha Halim, MD

## 2018-12-13 NOTE — Progress Notes (Signed)
tdap today Baby scripts

## 2018-12-14 LAB — CBC
Hematocrit: 32.4 % — ABNORMAL LOW (ref 34.0–46.6)
Hemoglobin: 11.4 g/dL (ref 11.1–15.9)
MCH: 30 pg (ref 26.6–33.0)
MCHC: 35.2 g/dL (ref 31.5–35.7)
MCV: 85 fL (ref 79–97)
Platelets: 214 10*3/uL (ref 150–450)
RBC: 3.8 x10E6/uL (ref 3.77–5.28)
RDW: 12.5 % (ref 11.7–15.4)
WBC: 12.6 10*3/uL — ABNORMAL HIGH (ref 3.4–10.8)

## 2018-12-14 LAB — THYROID PANEL WITH TSH
Free Thyroxine Index: 1.4 (ref 1.2–4.9)
T3 Uptake Ratio: 12 % — ABNORMAL LOW (ref 24–39)
T4, Total: 11.8 ug/dL (ref 4.5–12.0)
TSH: 2.62 u[IU]/mL (ref 0.450–4.500)

## 2018-12-14 LAB — HIV ANTIBODY (ROUTINE TESTING W REFLEX): HIV Screen 4th Generation wRfx: NONREACTIVE

## 2018-12-14 LAB — GLUCOSE TOLERANCE, 2 HOURS W/ 1HR
Glucose, 1 hour: 123 mg/dL (ref 65–179)
Glucose, 2 hour: 120 mg/dL (ref 65–152)
Glucose, Fasting: 82 mg/dL (ref 65–91)

## 2018-12-14 LAB — RPR: RPR Ser Ql: NONREACTIVE

## 2019-01-03 ENCOUNTER — Other Ambulatory Visit: Payer: Self-pay

## 2019-01-03 ENCOUNTER — Telehealth (INDEPENDENT_AMBULATORY_CARE_PROVIDER_SITE_OTHER): Payer: Medicaid Other | Admitting: Obstetrics & Gynecology

## 2019-01-03 ENCOUNTER — Encounter: Payer: Medicaid Other | Admitting: Obstetrics & Gynecology

## 2019-01-03 VITALS — BP 133/76 | Wt 209.0 lb

## 2019-01-03 DIAGNOSIS — O99213 Obesity complicating pregnancy, third trimester: Secondary | ICD-10-CM

## 2019-01-03 DIAGNOSIS — Z3A31 31 weeks gestation of pregnancy: Secondary | ICD-10-CM

## 2019-01-03 DIAGNOSIS — Z3482 Encounter for supervision of other normal pregnancy, second trimester: Secondary | ICD-10-CM

## 2019-01-03 DIAGNOSIS — O99210 Obesity complicating pregnancy, unspecified trimester: Secondary | ICD-10-CM

## 2019-01-03 NOTE — Progress Notes (Signed)
   TELEHEALTH OBSTETRICS PRENATAL VIRTUAL VIDEO VISIT ENCOUNTER NOTE  Provider location: [facility] [place] [facility]   I connected with Bobbe Medico on 01/03/19 at  1:45 PM EDT by MyChart Video Encounter at home and verified that I am speaking with the correct person using two identifiers.   I discussed the limitations, risks, security and privacy concerns of performing an evaluation and management service by telephone and the availability of in person appointments. I also discussed with the patient that there may be a patient responsible charge related to this service. The patient expressed understanding and agreed to proceed. Subjective:  Deborah Rice is a 23 y.o. G2P1001 at [redacted]w[redacted]d being seen today for ongoing prenatal care.  She is currently monitored for the following issues for this low-risk pregnancy and has Supervision of normal pregnancy in second trimester; BMI 30s; Obesity in pregnancy; and Low TSH level on their problem list.  Patient reports that her feet are going numb for the last 2 weeks, happens throughout the day, also feels like her left hip is "numb" when she wakes up. Takes about 10 minutes before she can walk. Contractions: Not present. Vag. Bleeding: None.  Movement: Present. Denies any leaking of fluid.   The following portions of the patient's history were reviewed and updated as appropriate: allergies, current medications, past family history, past medical history, past social history, past surgical history and problem list.   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   Objective:   Vitals:   01/03/19 1358  BP: 133/76  Weight: 209 lb (94.8 kg)    Fetal Status:     Movement: Present     General:  Alert, oriented and cooperative. Patient is in no acute distress.  Respiratory: Normal respiratory effort, no problems with respiration noted  Mental Status: Normal mood and affect. Normal behavior. Normal judgment and thought content.  Rest of  physical exam deferred due to type of encounter  Imaging: No results found.  Assessment and Plan:  Pregnancy: G2P1001 at [redacted]w[redacted]d 1. Obesity in pregnancy  2. Encounter for supervision of other normal pregnancy in second trimester  3. Rec that she see a chiropractor regarding her hip and feet issues  Preterm labor symptoms and general obstetric precautions including but not limited to vaginal bleeding, contractions, leaking of fluid and fetal movement were reviewed in detail with the patient. I discussed the assessment and treatment plan with the patient. The patient was provided an opportunity to ask questions and all were answered. The patient agreed with the plan and demonstrated an understanding of the instructions. The patient was advised to call back or seek an in-person office evaluation/go to MAU at [facility] for any urgent or concerning symptoms. Please refer to After Visit Summary for other counseling recommendations.   I provided 10 minutes of face-to-face time during this encounter.  No follow-ups on file.  No future appointments.  Emily Filbert, MD [facility] [place]

## 2019-01-03 NOTE — Progress Notes (Signed)
I connected with  Bobbe Medico on 01/03/19 at  1:45 PM EDT by telephone and verified that I am speaking with the correct person using two identifiers.   I discussed the limitations, risks, security and privacy concerns of performing an evaluation and management service by telephone and the availability of in person appointments. I also discussed with the patient that there may be a patient responsible charge related to this service. The patient expressed understanding and agreed to proceed.  Crosby Oyster, RN 01/03/2019  1:59 PM   Having some left sided pain, and feet have been going numb and travel up legs More frequent headaches

## 2019-01-05 ENCOUNTER — Other Ambulatory Visit: Payer: Self-pay | Admitting: Family Medicine

## 2019-01-19 ENCOUNTER — Telehealth (INDEPENDENT_AMBULATORY_CARE_PROVIDER_SITE_OTHER): Payer: Medicaid Other | Admitting: Obstetrics and Gynecology

## 2019-01-19 ENCOUNTER — Other Ambulatory Visit: Payer: Self-pay

## 2019-01-19 VITALS — BP 105/70

## 2019-01-19 DIAGNOSIS — Z3A33 33 weeks gestation of pregnancy: Secondary | ICD-10-CM | POA: Diagnosis not present

## 2019-01-19 DIAGNOSIS — Z3480 Encounter for supervision of other normal pregnancy, unspecified trimester: Secondary | ICD-10-CM

## 2019-01-19 DIAGNOSIS — Z3483 Encounter for supervision of other normal pregnancy, third trimester: Secondary | ICD-10-CM

## 2019-01-19 NOTE — Progress Notes (Signed)
I connected with  Bobbe Medico on 01/19/19 at  9:15 AM EDT by telephone and verified that I am speaking with the correct person using two identifiers.   I discussed the limitations, risks, security and privacy concerns of performing an evaluation and management service by telephone and the availability of in person appointments. I also discussed with the patient that there may be a patient responsible charge related to this service. The patient expressed understanding and agreed to proceed.  Crosby Oyster, RN 01/19/2019  9:32 AM

## 2019-01-19 NOTE — Progress Notes (Signed)
   TELEHEALTH VIRTUAL OBSTETRICS VISIT ENCOUNTER NOTE  I connected with Deborah Rice on 01/19/19 at  9:15 AM EDT by telephone at home and verified that I am speaking with the correct person using two identifiers.   I discussed the limitations, risks, security and privacy concerns of performing an evaluation and management service by telephone and the availability of in person appointments. I also discussed with the patient that there may be a patient responsible charge related to this service. The patient expressed understanding and agreed to proceed.  Subjective:  Deborah Rice is a 23 y.o. G2P1001 at [redacted]w[redacted]d being followed for ongoing prenatal care.  She is currently monitored for the following issues for this low-risk pregnancy and has Supervision of normal pregnancy in second trimester; BMI 30s; Obesity in pregnancy; and Low TSH level on their problem list.  Patient reports no complaints. Reports fetal movement. Denies any contractions, bleeding or leaking of fluid.   The following portions of the patient's history were reviewed and updated as appropriate: allergies, current medications, past family history, past medical history, past social history, past surgical history and problem list.   Objective:   Vitals:   01/19/19 0932  BP: 105/70    Babyscripts Data Reviewed: yes  General:  Alert, oriented and cooperative.   Mental Status: Normal mood and affect perceived. Normal judgment and thought content.  Rest of physical exam deferred due to type of encounter  Assessment and Plan:  Pregnancy: G2P1001 at [redacted]w[redacted]d 1. Supervision of other normal pregnancy, antepartum Routine care. gbs pos last pregnancy. Desires re test this pregnancy  Preterm labor symptoms and general obstetric precautions including but not limited to vaginal bleeding, contractions, leaking of fluid and fetal movement were reviewed in detail with the patient.  I discussed the assessment and treatment plan with the  patient. The patient was provided an opportunity to ask questions and all were answered. The patient agreed with the plan and demonstrated an understanding of the instructions. The patient was advised to call back or seek an in-person office evaluation/go to MAU at [facility] for any urgent or concerning symptoms. Please refer to After Visit Summary for other counseling recommendations.   I provided 7 minutes of non-face-to-face time during this encounter. The visit was conducted via MyChart-medicine  Return in about 2 weeks (around 02/02/2019).  No future appointments.  Aletha Halim, MD [facility] [place]

## 2019-01-31 ENCOUNTER — Telehealth: Payer: Self-pay | Admitting: Radiology

## 2019-01-31 NOTE — Telephone Encounter (Signed)
Left message that appointment 02/02/19 will be virtual with Dr Ilda Basset

## 2019-02-02 ENCOUNTER — Other Ambulatory Visit: Payer: Self-pay

## 2019-02-02 ENCOUNTER — Telehealth (INDEPENDENT_AMBULATORY_CARE_PROVIDER_SITE_OTHER): Payer: Medicaid Other | Admitting: Obstetrics and Gynecology

## 2019-02-02 DIAGNOSIS — Z3482 Encounter for supervision of other normal pregnancy, second trimester: Secondary | ICD-10-CM

## 2019-02-02 DIAGNOSIS — Z3A35 35 weeks gestation of pregnancy: Secondary | ICD-10-CM | POA: Diagnosis not present

## 2019-02-02 DIAGNOSIS — O99213 Obesity complicating pregnancy, third trimester: Secondary | ICD-10-CM

## 2019-02-02 DIAGNOSIS — O99210 Obesity complicating pregnancy, unspecified trimester: Secondary | ICD-10-CM

## 2019-02-02 NOTE — Progress Notes (Addendum)
   TELEHEALTH VIRTUAL OBSTETRICS VISIT ENCOUNTER NOTE  I connected with Tiasha Inglis on 02/02/19 at  1:30 PM EDT by telephone at home and verified that I am speaking with the correct person using two identifiers.   I discussed the limitations, risks, security and privacy concerns of performing an evaluation and management service by telephone and the availability of in person appointments. I also discussed with the patient that there may be a patient responsible charge related to this service. The patient expressed understanding and agreed to proceed.  Subjective:  Lizzie An is a 23 y.o. G2P1001 at [redacted]w[redacted]d being followed for ongoing prenatal care.  She is currently monitored for the following issues for this low-risk pregnancy and has Supervision of normal pregnancy in second trimester; BMI 30s; Obesity in pregnancy; and Low TSH level on their problem list.  Patient reports no complaints. Reports fetal movement. Denies any contractions, bleeding or leaking of fluid.   The following portions of the patient's history were reviewed and updated as appropriate: allergies, current medications, past family history, past medical history, past social history, past surgical history and problem list.   Objective:  There were no vitals filed for this visit.  Babyscripts Data Reviewed: yes  General:  Alert, oriented and cooperative.   Mental Status: Normal mood and affect perceived. Normal judgment and thought content.  Rest of physical exam deferred due to type of encounter  Assessment and Plan:  Pregnancy: G2P1001 at [redacted]w[redacted]d 1. Encounter for supervision of other normal pregnancy in third trimester Routine care. gbs nv Pt lost bp cuff. Pt told to keep doing weekly weights and let us know if still cant find bp cuff at nv  Preterm labor symptoms and general obstetric precautions including but not limited to vaginal bleeding, contractions, leaking of fluid and fetal movement were reviewed in detail  with the patient.  I discussed the assessment and treatment plan with the patient. The patient was provided an opportunity to ask questions and all were answered. The patient agreed with the plan and demonstrated an understanding of the instructions. The patient was advised to call back or seek an in-person office evaluation/go to MAU at [facility] for any urgent or concerning symptoms. Please refer to After Visit Summary for other counseling recommendations.   I provided 7 minutes of non-face-to-face time during this encounter. The visit was conducted via MyChart video-medicine  Return in about 1 week (around 02/09/2019).  No future appointments.  Aletha Halim, MD [facility] [place]

## 2019-02-02 NOTE — Progress Notes (Signed)
I connected with  Deborah Rice on 02/02/19 at  1:30 PM EDT by telephone and verified that I am speaking with the correct person using two identifiers.   I discussed the limitations, risks, security and privacy concerns of performing an evaluation and management service by telephone and the availability of in person appointments. I also discussed with the patient that there may be a patient responsible charge related to this service. The patient expressed understanding and agreed to proceed.  [name], CMA 02/02/2019  1:44 PM

## 2019-02-13 ENCOUNTER — Encounter: Payer: Self-pay | Admitting: Obstetrics & Gynecology

## 2019-02-13 ENCOUNTER — Other Ambulatory Visit: Payer: Self-pay

## 2019-02-13 ENCOUNTER — Ambulatory Visit (INDEPENDENT_AMBULATORY_CARE_PROVIDER_SITE_OTHER): Payer: Medicaid Other | Admitting: Obstetrics & Gynecology

## 2019-02-13 ENCOUNTER — Other Ambulatory Visit (HOSPITAL_COMMUNITY)
Admission: RE | Admit: 2019-02-13 | Discharge: 2019-02-13 | Disposition: A | Discharge status: Home / Self Care | Payer: Medicaid Other | Source: Ambulatory Visit | Attending: Obstetrics & Gynecology | Admitting: Obstetrics & Gynecology

## 2019-02-13 VITALS — BP 119/77 | HR 72 | Wt 215.0 lb

## 2019-02-13 DIAGNOSIS — Z3482 Encounter for supervision of other normal pregnancy, second trimester: Secondary | ICD-10-CM

## 2019-02-13 DIAGNOSIS — Z3A37 37 weeks gestation of pregnancy: Secondary | ICD-10-CM

## 2019-02-13 NOTE — Patient Instructions (Signed)
Return to office for any scheduled appointments. Call the office or go to the MA[facility] at [facility] if:  You begin to have strong, frequent contractions  Your water breaks.  Sometimes it is a big gush of fluid, sometimes it is just a trickle that keeps getting your panties wet or running down your legs  You have vaginal bleeding.  It is normal to have a small amount of spotting if your cervix was checked.   You do not feel your baby moving like normal.  If you do not, get something to eat and drink and lay down and focus on feeling your baby move.   If your baby is still not moving like normal, you should call the office or go to MAU.  Any other obstetric concerns.   

## 2019-02-13 NOTE — Progress Notes (Signed)
   PRENATAL VISIT NOTE  Subjective:  Deborah Rice is a 23 y.o. G2P1001 at [redacted]w[redacted]d being seen today for ongoing prenatal care.  She is currently monitored for the following issues for this low-risk pregnancy and has Supervision of normal pregnancy in second trimester; BMI 30s; Obesity in pregnancy; and Low TSH level on their problem list.  Patient reports occasional contractions.  Contractions: Not present. Vag. Bleeding: None.  Movement: Present. Denies leaking of fluid.   The following portions of the patient's history were reviewed and updated as appropriate: allergies, current medications, past family history, past medical history, past social history, past surgical history and problem list.   Objective:   Vitals:   02/13/19 1534  BP: 119/77  Pulse: 72  Weight: 215 lb (97.5 kg)    Fetal Status: Fetal Heart Rate (bpm): 144   Movement: Present  Presentation: Vertex  General:  Alert, oriented and cooperative. Patient is in no acute distress.  Skin: Skin is warm and dry. No rash noted.   Cardiovascular: Normal heart rate noted  Respiratory: Normal respiratory effort, no problems with respiration noted  Abdomen: Soft, gravid, appropriate for gestational age.  Pain/Pressure: Present     Pelvic: Cervical exam performed Dilation: 3 Effacement (%): 50 Station: Ballotable  Extremities: Normal range of motion.  Edema: Trace  Mental Status: Normal mood and affect. Normal behavior. Normal judgment and thought content.   Assessment and Plan:  Pregnancy: G2P1001 at [redacted]w[redacted]d 1. Encounter for supervision of other normal pregnancy in second trimester Pelvic cultures done. - Culture, beta strep (group b only) - GC/Chlamydia probe amp ([place])not at [facility] Term labor symptoms and general obstetric precautions including but not limited to vaginal bleeding, contractions, leaking of fluid and fetal movement were reviewed in detail with the patient. Please refer to After Visit Summary for other  counseling recommendations.   Return in about 1 week (around 02/20/2019) for Virtual LOB Visit.  No future appointments.  Verita Schneiders, MD

## 2019-02-15 LAB — GC/CHLAMYDIA PROBE AMP (~~LOC~~) NOT AT ARMC
Chlamydia: NEGATIVE
Neisseria Gonorrhea: NEGATIVE

## 2019-02-16 LAB — CULTURE, BETA STREP (GROUP B ONLY): Strep Gp B Culture: NEGATIVE

## 2019-02-21 ENCOUNTER — Telehealth: Payer: Medicaid Other | Admitting: Family Medicine

## 2019-02-21 ENCOUNTER — Telehealth (INDEPENDENT_AMBULATORY_CARE_PROVIDER_SITE_OTHER): Payer: Medicaid Other | Admitting: Family Medicine

## 2019-02-21 ENCOUNTER — Other Ambulatory Visit: Payer: Self-pay

## 2019-02-21 ENCOUNTER — Encounter: Payer: Self-pay | Admitting: Family Medicine

## 2019-02-21 VITALS — BP 136/83 | Wt 216.0 lb

## 2019-02-21 DIAGNOSIS — Z3A38 38 weeks gestation of pregnancy: Secondary | ICD-10-CM

## 2019-02-21 DIAGNOSIS — O99210 Obesity complicating pregnancy, unspecified trimester: Secondary | ICD-10-CM

## 2019-02-21 DIAGNOSIS — Z3480 Encounter for supervision of other normal pregnancy, unspecified trimester: Secondary | ICD-10-CM

## 2019-02-21 DIAGNOSIS — O99213 Obesity complicating pregnancy, third trimester: Secondary | ICD-10-CM

## 2019-02-21 NOTE — Progress Notes (Signed)
I connected with@ on 02/21/19 at 11:00 AM EDT by: mychart video and verified that I am speaking with the correct person using two identifiers.  Patient is located at home and provider is located at [facility].     The purpose of this virtual visit is to provide medical care while limiting exposure to the novel coronavirus. I discussed the limitations, risks, security and privacy concerns of performing an evaluation and management service by Video and the availability of in person appointments. I also discussed with the patient that there may be a patient responsible charge related to this service. By engaging in this virtual visit, you consent to the provision of healthcare.  Additionally, you authorize for your insurance to be billed for the services provided during this visit.  The patient expressed understanding and agreed to proceed.  The following staff members participated in the virtual visit:  Crosby Oyster    PRENATAL VISIT NOTE  Subjective:  Deborah Rice is a 23 y.o. G2P1001 at [redacted]w[redacted]d  for phone visit for ongoing prenatal care.  She is currently monitored for the following issues for this low-risk pregnancy and has Supervision of other normal pregnancy, antepartum; BMI 30s; Obesity in pregnancy; and Low TSH level on their problem list.  Patient reports no complaints.  Contractions: Irregular. Vag. Bleeding: None.  Movement: Present. Denies leaking of fluid.   The following portions of the patient's history were reviewed and updated as appropriate: allergies, current medications, past family history, past medical history, past social history, past surgical history and problem list.   Objective:   Vitals:   02/21/19 1120  BP: 136/83  Weight: 216 lb (98 kg)   Self-Obtained  Fetal Status:     Movement: Present     Assessment and Plan:  Pregnancy: G2P1001 at [redacted]w[redacted]d  1. Supervision of other normal pregnancy, antepartum Up to date Reviewed labor s/sx  Discussed planning for IOL visit  next week Reviewed pregnancy box and updated appropriatley  2. Obesity in pregnancy TWG= -4 lb (-1.814 kg) which is at/below goal.     Term labor symptoms and general obstetric precautions including but not limited to vaginal bleeding, contractions, leaking of fluid and fetal movement were reviewed in detail with the patient.  Return in about 1 week (around 02/28/2019) for Routine prenatal care.  No future appointments.   Time spent on virtual visit: 15 minutes  Caren Macadam, MD

## 2019-02-28 ENCOUNTER — Other Ambulatory Visit: Payer: Self-pay

## 2019-02-28 ENCOUNTER — Encounter: Payer: Self-pay | Admitting: Obstetrics & Gynecology

## 2019-02-28 ENCOUNTER — Ambulatory Visit (INDEPENDENT_AMBULATORY_CARE_PROVIDER_SITE_OTHER): Payer: Medicaid Other | Admitting: Obstetrics & Gynecology

## 2019-02-28 VITALS — BP 107/72 | HR 70 | Wt 213.0 lb

## 2019-02-28 DIAGNOSIS — Z3A39 39 weeks gestation of pregnancy: Secondary | ICD-10-CM

## 2019-02-28 DIAGNOSIS — Z3480 Encounter for supervision of other normal pregnancy, unspecified trimester: Secondary | ICD-10-CM

## 2019-02-28 DIAGNOSIS — Z3483 Encounter for supervision of other normal pregnancy, third trimester: Secondary | ICD-10-CM

## 2019-02-28 NOTE — Patient Instructions (Signed)
Return to office for any scheduled appointments. Call the office or go to the MAU at [place] [facility] if:  You begin to have strong, frequent contractions  Your water breaks.  Sometimes it is a big gush of fluid, sometimes it is just a trickle that keeps getting your panties wet or running down your legs  You have vaginal bleeding.  It is normal to have a small amount of spotting if your cervix was checked.   You do not feel your baby moving like normal.  If you do not, get something to eat and drink and lay down and focus on feeling your baby move.   If your baby is still not moving like normal, you should call the office or go to MAU.  Any other obstetric concerns.     Labor Induction  Labor induction is when steps are taken to cause a pregnant woman to begin the labor process. Most women go into labor on their own between 37 weeks and 42 weeks of pregnancy. When this does not happen or when there is a medical need for labor to begin, steps may be taken to induce labor. Labor induction causes a pregnant woman's uterus to contract. It also causes the cervix to soften (ripen), open (dilate), and thin out (efface). Usually, labor is not induced before 39 weeks of pregnancy unless there is a medical reason to do so. Your health care provider will determine if labor induction is needed. Before inducing labor, your health care provider will consider a number of factors, including:  Your medical condition and your baby's.  How many weeks along you are in your pregnancy.  How mature your baby's lungs are.  The condition of your cervix.  The position of your baby.  The size of your birth canal. What are some reasons for labor induction? Labor may be induced if:  Your health or your baby's health is at risk.  Your pregnancy is overdue by 1 week or more.  Your water breaks but labor does not start on its own.  There is a low amount of amniotic fluid around your  baby. You may also choose (elect) to have labor induced at a certain time. Generally, elective labor induction is done no earlier than 39 weeks of pregnancy. What methods are used for labor induction? Methods used for labor induction include:  Prostaglandin medicine. This medicine starts contractions and causes the cervix to dilate and ripen. It can be taken by mouth (orally) or by being inserted into the vagina (suppository).  Inserting a small, thin tube (catheter) with a balloon into the vagina and then expanding the balloon with water to dilate the cervix.  Stripping the membranes. In this method, your health care provider gently separates amniotic sac tissue from the cervix. This causes the cervix to stretch, which in turn causes the release of a hormone called progesterone. The hormone causes the uterus to contract. This procedure is often done during an office visit, after which you will be sent home to wait for contractions to begin.  Breaking the water. In this method, your health care provider uses a small instrument to make a small hole in the amniotic sac. This eventually causes the amniotic sac to break. Contractions should begin after a few hours.  Medicine to trigger or strengthen contractions. This medicine is given through an IV that is inserted into a vein in your arm. Except for membrane stripping, which can be done in a  clinic, labor induction is done in the hospital so that you and your baby can be carefully monitored. How long does it take for labor to be induced? The length of time it takes to induce labor depends on how ready your body is for labor. Some inductions can take up to 2-3 days, while others may take less than a day. Induction may take longer if:  You are induced early in your pregnancy.  It is your first pregnancy.  Your cervix is not ready. What are some risks associated with labor induction? Some risks associated with labor induction include:  Changes  in fetal heart rate, such as being too high, too low, or irregular (erratic).  Failed induction.  Infection in the mother or the baby.  Increased risk of having a cesarean delivery.  Fetal death.  Breaking off (abruption) of the placenta from the uterus (rare).  Rupture of the uterus (very rare). When induction is needed for medical reasons, the benefits of induction generally outweigh the risks. What are some reasons for not inducing labor? Labor induction should not be done if:  Your baby does not tolerate contractions.  You have had previous surgeries on your uterus, such as a myomectomy, removal of fibroids, or a vertical scar from a previous cesarean delivery.  Your placenta lies very low in your uterus and blocks the opening of the cervix (placenta previa).  Your baby is not in a head-down position.  The umbilical cord drops down into the birth canal in front of the baby.  There are unusual circumstances, such as the baby being very early (premature).  You have had more than 2 previous cesarean deliveries. Summary  Labor induction is when steps are taken to cause a pregnant woman to begin the labor process.  Labor induction causes a pregnant woman's uterus to contract. It also causes the cervix to ripen, dilate, and efface.  Labor is not induced before 39 weeks of pregnancy unless there is a medical reason to do so.  When induction is needed for medical reasons, the benefits of induction generally outweigh the risks. This information is not intended to replace advice given to you by your health care provider. Make sure you discuss any questions you have with your health care provider. Document Released: 12/02/2006 Document Revised: 07/16/2017 Document Reviewed: 08/26/2016 Elsevier Patient Education  2020 [employer].

## 2019-02-28 NOTE — Progress Notes (Signed)
   PRENATAL VISIT NOTE  Subjective:  Deborah Rice is a 23 y.o. G2P1001 at [redacted]w[redacted]d being seen today for ongoing prenatal care.  She is currently monitored for the following issues for this low-risk pregnancy and has Supervision of other normal pregnancy, antepartum; BMI 30s; Obesity in pregnancy; and Low TSH level on their problem list.  Patient reports occasional contractions.  Contractions: Irregular. Vag. Bleeding: None.  Movement: Present. Denies leaking of fluid.   The following portions of the patient's history were reviewed and updated as appropriate: allergies, current medications, past family history, past medical history, past social history, past surgical history and problem list.   Objective:   Vitals:   02/28/19 1112  BP: 107/72  Pulse: 70  Weight: 213 lb (96.6 kg)    Fetal Status: Fetal Heart Rate (bpm): 133   Movement: Present  Presentation: Vertex  General:  Alert, oriented and cooperative. Patient is in no acute distress.  Skin: Skin is warm and dry. No rash noted.   Cardiovascular: Normal heart rate noted  Respiratory: Normal respiratory effort, no problems with respiration noted  Abdomen: Soft, gravid, appropriate for gestational age.  Pain/Pressure: Present     Pelvic: Cervical exam performed Dilation: 3 Effacement (%): 50 Station: Ballotable  Extremities: Normal range of motion.  Edema: None  Mental Status: Normal mood and affect. Normal behavior. Normal judgment and thought content.   Assessment and Plan:  Pregnancy: G2P1001 at [redacted]w[redacted]d 1. Supervision of other normal pregnancy, antepartum IOL scheduled at 41 weeks, postdates testing to start next week if still pregnant. Term labor symptoms and general obstetric precautions including but not limited to vaginal bleeding, contractions, leaking of fluid and fetal movement were reviewed in detail with the patient. Please refer to After Visit Summary for other counseling recommendations.   Return in about 1 week  (around 03/07/2019) for OFFICE OB Visit, NST, AFI       IOL scheduled 03/11/19.  Future Appointments  Date Time Provider [place]  03/07/2019  2:45 PM Donnamae Jude, MD [facility]  03/11/2019  8:00 AM [facility] SCHED ROOM [facility] None    Verita Schneiders, MD

## 2019-03-01 ENCOUNTER — Telehealth (HOSPITAL_COMMUNITY): Payer: Self-pay | Admitting: *Deleted

## 2019-03-01 ENCOUNTER — Other Ambulatory Visit: Payer: Self-pay

## 2019-03-01 ENCOUNTER — Inpatient Hospital Stay (HOSPITAL_COMMUNITY)
Admission: AD | Admit: 2019-03-01 | Discharge: 2019-03-03 | DRG: 807 | Disposition: A | Discharge status: Home / Self Care | Payer: Medicaid Other | Attending: Obstetrics & Gynecology | Admitting: Obstetrics & Gynecology

## 2019-03-01 ENCOUNTER — Encounter (HOSPITAL_COMMUNITY): Payer: Self-pay | Admitting: *Deleted

## 2019-03-01 DIAGNOSIS — E669 Obesity, unspecified: Secondary | ICD-10-CM | POA: Diagnosis present

## 2019-03-01 DIAGNOSIS — O99214 Obesity complicating childbirth: Secondary | ICD-10-CM | POA: Diagnosis present

## 2019-03-01 DIAGNOSIS — Z30017 Encounter for initial prescription of implantable subdermal contraceptive: Secondary | ICD-10-CM | POA: Diagnosis not present

## 2019-03-01 DIAGNOSIS — Z3480 Encounter for supervision of other normal pregnancy, unspecified trimester: Secondary | ICD-10-CM

## 2019-03-01 DIAGNOSIS — Z3A39 39 weeks gestation of pregnancy: Secondary | ICD-10-CM

## 2019-03-01 DIAGNOSIS — Z20828 Contact with and (suspected) exposure to other viral communicable diseases: Secondary | ICD-10-CM | POA: Diagnosis present

## 2019-03-01 DIAGNOSIS — O26893 Other specified pregnancy related conditions, third trimester: Secondary | ICD-10-CM | POA: Diagnosis present

## 2019-03-01 LAB — CBC
HCT: 38.5 % (ref 36.0–46.0)
Hemoglobin: 12.3 g/dL (ref 12.0–15.0)
MCH: 27.8 pg (ref 26.0–34.0)
MCHC: 31.9 g/dL (ref 30.0–36.0)
MCV: 86.9 fL (ref 80.0–100.0)
Platelets: 198 10*3/uL (ref 150–400)
RBC: 4.43 MIL/uL (ref 3.87–5.11)
RDW: 13.2 % (ref 11.5–15.5)
WBC: 13.5 10*3/uL — ABNORMAL HIGH (ref 4.0–10.5)
nRBC: 0 % (ref 0.0–0.2)

## 2019-03-01 LAB — TYPE AND SCREEN
ABO/RH(D): A POS
Antibody Screen: NEGATIVE

## 2019-03-01 LAB — ABO/RH: ABO/RH(D): A POS

## 2019-03-01 LAB — SARS CORONAVIRUS 2 BY RT PCR (HOSPITAL ORDER, PERFORMED IN ~~LOC~~ HOSPITAL LAB): SARS Coronavirus 2: NEGATIVE

## 2019-03-01 MED ORDER — HYDROXYZINE HCL 50 MG PO TABS: 50.0000 mg | ORAL_TABLET | Freq: Four times a day (QID) | ORAL | Status: DC | PRN

## 2019-03-01 MED ORDER — ACETAMINOPHEN 325 MG PO TABS: 650.0000 mg | ORAL_TABLET | ORAL | Status: DC | PRN

## 2019-03-01 MED ORDER — FENTANYL-BUPIVACAINE-NACL 0.5-0.125-0.9 MG/250ML-% EP SOLN: 12.0000 mL/h | EPIDURAL | Status: DC | PRN

## 2019-03-01 MED ORDER — DIPHENHYDRAMINE HCL 50 MG/ML IJ SOLN: 12.5000 mg | INTRAMUSCULAR | Status: DC | PRN

## 2019-03-01 MED ORDER — ZOLPIDEM TARTRATE 5 MG PO TABS: 5.0000 mg | ORAL_TABLET | Freq: Every evening | ORAL | Status: DC | PRN

## 2019-03-01 MED ORDER — LIDOCAINE HCL (PF) 1 % IJ SOLN: 30.0000 mL | INTRAMUSCULAR | Status: DC | PRN

## 2019-03-01 MED ORDER — OXYTOCIN BOLUS FROM INFUSION: 500.0000 mL | Freq: Once | INTRAVENOUS | Status: DC

## 2019-03-01 MED ORDER — LACTATED RINGERS IV SOLN: 500.0000 mL | INTRAVENOUS | Status: DC | PRN

## 2019-03-01 MED ORDER — MISOPROSTOL 25 MCG QUARTER TABLET: 25.0000 ug | ORAL_TABLET | ORAL | Status: DC | PRN

## 2019-03-01 MED ORDER — PHENYLEPHRINE 40 MCG/ML (10ML) SYRINGE FOR IV PUSH (FOR BLOOD PRESSURE SUPPORT): 80.0000 ug | PREFILLED_SYRINGE | INTRAVENOUS | Status: DC | PRN

## 2019-03-01 MED ORDER — OXYCODONE-ACETAMINOPHEN 5-325 MG PO TABS
1.0000 | ORAL_TABLET | ORAL | Status: DC | PRN
  Administered 2019-03-01: 1 via ORAL
  Filled 2019-03-01: qty 1

## 2019-03-01 MED ORDER — OXYCODONE-ACETAMINOPHEN 5-325 MG PO TABS: 2.0000 | ORAL_TABLET | ORAL | Status: DC | PRN

## 2019-03-01 MED ORDER — FENTANYL CITRATE (PF) 100 MCG/2ML IJ SOLN
50.0000 ug | INTRAMUSCULAR | Status: DC | PRN
  Filled 2019-03-01: qty 2

## 2019-03-01 MED ORDER — TERBUTALINE SULFATE 1 MG/ML IJ SOLN: 0.2500 mg | Freq: Once | INTRAMUSCULAR | Status: DC | PRN

## 2019-03-01 MED ORDER — ONDANSETRON HCL 4 MG/2ML IJ SOLN: 4.0000 mg | Freq: Four times a day (QID) | INTRAMUSCULAR | Status: DC | PRN

## 2019-03-01 MED ORDER — EPHEDRINE 5 MG/ML INJ: 10.0000 mg | INTRAVENOUS | Status: DC | PRN

## 2019-03-01 MED ORDER — LACTATED RINGERS IV SOLN: 500.0000 mL | Freq: Once | INTRAVENOUS | Status: DC

## 2019-03-01 MED ORDER — OXYTOCIN 40 UNITS IN NORMAL SALINE INFUSION - SIMPLE MED: 2.5000 [IU]/h | INTRAVENOUS | Status: DC

## 2019-03-01 MED ORDER — SOD CITRATE-CITRIC ACID 500-334 MG/5ML PO SOLN: 30.0000 mL | ORAL | Status: DC | PRN

## 2019-03-01 MED ORDER — OXYTOCIN BOLUS FROM INFUSION
500.0000 mL | Freq: Once | INTRAVENOUS | Status: AC
  Administered 2019-03-01: 21:00:00 500 mL via INTRAVENOUS

## 2019-03-01 MED ORDER — OXYTOCIN 40 UNITS IN NORMAL SALINE INFUSION - SIMPLE MED: 1.0000 m[IU]/min | INTRAVENOUS | Status: DC

## 2019-03-01 MED ORDER — FLEET ENEMA 7-19 GM/118ML RE ENEM: 1.0000 | ENEMA | RECTAL | Status: DC | PRN

## 2019-03-01 MED ORDER — FENTANYL CITRATE (PF) 100 MCG/2ML IJ SOLN: 50.0000 ug | INTRAMUSCULAR | Status: DC | PRN

## 2019-03-01 MED ORDER — OXYTOCIN 40 UNITS IN NORMAL SALINE INFUSION - SIMPLE MED
2.5000 [IU]/h | INTRAVENOUS | Status: DC
  Filled 2019-03-01: qty 1000

## 2019-03-01 MED ORDER — LACTATED RINGERS IV SOLN: INTRAVENOUS | Status: DC

## 2019-03-01 MED ORDER — OXYCODONE-ACETAMINOPHEN 5-325 MG PO TABS: 1.0000 | ORAL_TABLET | ORAL | Status: DC | PRN

## 2019-03-01 MED ORDER — FLEET ENEMA 7-19 GM/118ML RE ENEM: 1.0000 | ENEMA | Freq: Every day | RECTAL | Status: DC | PRN

## 2019-03-01 NOTE — MAU Note (Signed)
Presents with c/o regular ctxs since 1500 this afternoon.  Denies ROM, reports bloody show with wiping.  Endorses +FM.

## 2019-03-01 NOTE — H&P (Addendum)
LABOR AND DELIVERY ADMISSION HISTORY AND PHYSICAL NOTE  Deborah Rice is a 23 y.o. female G2P1001 with IUP at [redacted]w[redacted]d by LMP=12w u/s presenting for active labor.   She reports positive fetal movement. She denies leakage of fluid or vaginal bleeding. She endorses contractions every 1-2 minutes.  Prenatal History/Complications: PNC at [facility] Pregnancy complications:  - Low TSH (07/2018), nml T3/T4 - Maternal obesity  Past Medical History: Past Medical History:  Diagnosis Date  . Asthma     Past Surgical History: Past Surgical History:  Procedure Laterality Date  . [place] EXTRACTION  2014    Obstetrical History: OB History    Gravida  2   Para  1   Term  1   Preterm      AB      Living  1     SAB      TAB      Ectopic      Multiple      Live Births  1           Social History: Social History   Socioeconomic History  . Marital status: Single    Spouse name: Not on file  . Number of children: Not on file  . Years of education: Not on file  . Highest education level: Not on file  Occupational History  . Not on file  Social Needs  . Financial resource strain: Not on file  . Food insecurity    Worry: Not on file    Inability: Not on file  . Transportation needs    Medical: Not on file    Non-medical: Not on file  Tobacco Use  . Smoking status: Never Smoker  . Smokeless tobacco: Never Used  Substance and Sexual Activity  . Alcohol use: Not Currently  . Drug use: Not Currently  . Sexual activity: Yes  Lifestyle  . Physical activity    Days per week: Not on file    Minutes per session: Not on file  . Stress: Not on file  Relationships  . Social Herbalist on phone: Not on file    Gets together: Not on file    Attends religious service: Not on file    Active member of club or organization: Not on file    Attends meetings of clubs or organizations: Not on file    Relationship status: Not on file  Other Topics Concern  . Not on  file  Social History Narrative  . Not on file    Family History: Family History  Problem Relation Age of Onset  . Asthma Sister   . Asthma Brother   . Diabetes Maternal Grandmother     Allergies: No Known Allergies  Medications Prior to Admission  Medication Sig Dispense Refill Last Dose  . omeprazole (PRILOSEC OTC) 20 MG tablet Take 1 tablet (20 mg total) by mouth daily. 90 tablet 3 Past Week at Unknown time  . Prenatal Vit-Fe Fumarate-FA (PREPLUS) 27-1 MG TABS Take 1 tablet by mouth daily. 30 tablet 13 03/01/2019 at Unknown time     Review of Systems  All systems reviewed and negative except as stated in HPI  Physical Exam Blood pressure (!) 144/75, pulse (!) 115, temperature 98.4 F (36.9 C), temperature source Oral, resp. rate 16, last menstrual period 05/28/2018, SpO2 99 %. General appearance: alert, oriented, moderate distress Lungs: normal respiratory effort Heart: regular rate Abdomen: soft, non-tender; gravid, FH appropriate for GA Extremities: No calf swelling or  tenderness Presentation: cephalic by cervical exam Fetal monitoring: baseline 130 bpm, mod variability, +accels, no decels Uterine activity: q 2-3 minutes Cervical exam: 10/100/+2  Prenatal labs: ABO, Rh: --/--/A POS, A POS Performed at [facility] [address], [place], [place] [address]  [phone] 1925) Antibody: NEG (08/05 1925) Rubella: 2.04 (01/28 1439) RPR: Non Reactive (05/19 0837)  HBsAg: Negative (01/28 1439)  HIV: Non Reactive (05/19 0837)  GC/Chlamydia: negative GBS:   negative 2-hr GTT: 82/123/120 Genetic screening:  Low risk Anatomy US: unremarkable  Prenatal Transfer Tool  Maternal Diabetes: No Genetic Screening: Normal Maternal Ultrasounds/Referrals: Normal Fetal Ultrasounds or other Referrals:  None Maternal Substance Abuse:  No Significant Maternal Medications:  None Significant Maternal Lab Results: None  Results for orders placed or performed during the hospital  encounter of 03/01/19 (from the past 24 hour(s))  Type and screen [place]   Collection Time: 03/01/19  7:25 PM  Result Value Ref Range   ABO/RH(D) A POS    Antibody Screen NEG    Sample Expiration      03/04/2019,2359 Performed at [facility], [place] [address] [place], [place] [address]   ABO/Rh   Collection Time: 03/01/19  7:25 PM  Result Value Ref Range   ABO/RH(D)      A POS Performed at [facility] [address] [place], [place] [address]   CBC   Collection Time: 03/01/19  7:42 PM  Result Value Ref Range   WBC 13.5 (H) 4.0 - 10.5 K/uL   RBC 4.43 3.87 - 5.11 MIL/uL   Hemoglobin 12.3 12.0 - 15.0 g/dL   HCT 38.5 36.0 - 46.0 %   MCV 86.9 80.0 - 100.0 fL   MCH 27.8 26.0 - 34.0 pg   MCHC 31.9 30.0 - 36.0 g/dL   RDW 13.2 11.5 - 15.5 %   Platelets 198 150 - 400 K/uL   nRBC 0.0 0.0 - 0.2 %    Patient Active Problem List   Diagnosis Date Noted  . Indication for care in labor or delivery 03/01/2019  . Labor without complication 10/15/2246  . Low TSH level 09/10/2018  . Supervision of other normal pregnancy, antepartum 08/23/2018  . BMI 30s 08/23/2018  . Obesity in pregnancy 08/23/2018    Assessment: Deborah Rice is a 23 y.o. G2P1001 at [redacted]w[redacted]d here for active labor.  #Labor: progressing well, on evaluation patient is noted to be complete, labor table set up and delivery performed. Please see delivery note for further details. #Pain: Fentanyl PRN, pt desires epidurals, awaiting labs #FWB: Cat 1- continuous monitoring #ID:  gbs neg #Low TSH: re-check TSH @6  weeks post partum  Arrie Senate 09/01/35, 9:03 PM   Attestation:  I confirm that I have verified the information documented in the resident's note and that I have also personally reperformed the physical exam and all medical decision making activities.   The patient was seen and examined by me also Agree with note NST reactive and reassuring UCs as listed Cervical  exams as listed in note  Seabron Spates, CNM

## 2019-03-01 NOTE — Telephone Encounter (Signed)
Preadmission screen  

## 2019-03-02 DIAGNOSIS — Z30017 Encounter for initial prescription of implantable subdermal contraceptive: Secondary | ICD-10-CM

## 2019-03-02 LAB — RPR: RPR Ser Ql: NONREACTIVE

## 2019-03-02 MED ORDER — COCONUT OIL OIL: 1.0000 "application " | TOPICAL_OIL | Status: DC | PRN

## 2019-03-02 MED ORDER — TETANUS-DIPHTH-ACELL PERTUSSIS 5-2.5-18.5 LF-MCG/0.5 IM SUSP: 0.5000 mL | Freq: Once | INTRAMUSCULAR | Status: DC

## 2019-03-02 MED ORDER — SENNOSIDES-DOCUSATE SODIUM 8.6-50 MG PO TABS
2.0000 | ORAL_TABLET | ORAL | Status: DC
  Administered 2019-03-02: 2 via ORAL
  Filled 2019-03-02: qty 2

## 2019-03-02 MED ORDER — DIPHENHYDRAMINE HCL 25 MG PO CAPS: 25.0000 mg | ORAL_CAPSULE | Freq: Four times a day (QID) | ORAL | Status: DC | PRN

## 2019-03-02 MED ORDER — ONDANSETRON HCL 4 MG PO TABS: 4.0000 mg | ORAL_TABLET | ORAL | Status: DC | PRN

## 2019-03-02 MED ORDER — BENZOCAINE-MENTHOL 20-0.5 % EX AERO: 1.0000 "application " | INHALATION_SPRAY | CUTANEOUS | Status: DC | PRN

## 2019-03-02 MED ORDER — SIMETHICONE 80 MG PO CHEW: 80.0000 mg | CHEWABLE_TABLET | ORAL | Status: DC | PRN

## 2019-03-02 MED ORDER — PRENATAL MULTIVITAMIN CH
1.0000 | ORAL_TABLET | Freq: Every day | ORAL | Status: DC
  Administered 2019-03-02 – 2019-03-03 (×2): 1 via ORAL
  Filled 2019-03-02 (×2): qty 1

## 2019-03-02 MED ORDER — ACETAMINOPHEN 325 MG PO TABS
650.0000 mg | ORAL_TABLET | ORAL | Status: DC | PRN
  Administered 2019-03-03: 650 mg via ORAL
  Filled 2019-03-02: qty 2

## 2019-03-02 MED ORDER — IBUPROFEN 600 MG PO TABS
600.0000 mg | ORAL_TABLET | Freq: Four times a day (QID) | ORAL | Status: DC
  Administered 2019-03-02 – 2019-03-03 (×7): 600 mg via ORAL
  Filled 2019-03-02 (×7): qty 1

## 2019-03-02 MED ORDER — LIDOCAINE HCL 1 % IJ SOLN
0.0000 mL | Freq: Once | INTRAMUSCULAR | Status: AC | PRN
  Administered 2019-03-02: 3 mL via INTRADERMAL
  Filled 2019-03-02: qty 20

## 2019-03-02 MED ORDER — DIBUCAINE (PERIANAL) 1 % EX OINT: 1.0000 "application " | TOPICAL_OINTMENT | CUTANEOUS | Status: DC | PRN

## 2019-03-02 MED ORDER — ETONOGESTREL 68 MG ~~LOC~~ IMPL
68.0000 mg | DRUG_IMPLANT | Freq: Once | SUBCUTANEOUS | Status: AC
  Administered 2019-03-02: 68 mg via SUBCUTANEOUS
  Filled 2019-03-02: qty 1

## 2019-03-02 MED ORDER — ONDANSETRON HCL 4 MG/2ML IJ SOLN: 4.0000 mg | INTRAMUSCULAR | Status: DC | PRN

## 2019-03-02 MED ORDER — WITCH HAZEL-GLYCERIN EX PADS: 1.0000 "application " | MEDICATED_PAD | CUTANEOUS | Status: DC | PRN

## 2019-03-02 NOTE — Discharge Instructions (Signed)

## 2019-03-02 NOTE — Progress Notes (Addendum)
Post Partum Day 1 Subjective: no complaints, up ad lib, voiding and tolerating PO. Pain well-controlled. Desires Nexplanon for birth control. Planning for discharge tomorrow. Breastfeeding is going well. Planning to pay for circumcision and then have done inpatient.   Objective: Blood pressure 111/64, pulse 68, temperature 98 F (36.7 C), temperature source Oral, resp. rate 18, last menstrual period 05/28/2018, SpO2 99 %, unknown if currently breastfeeding.  Physical Exam:  General: alert, cooperative and appears stated age Lochia: appropriate Uterine Fundus: firm DVT Evaluation: No evidence of DVT seen on physical exam.  Recent Labs    03/01/19 1942  HGB 12.3  HCT 38.5    Assessment/Plan: Plan for discharge tomorrow, Breastfeeding, Circumcision prior to discharge and Contraception Nexplanon placed 03/02/2019   LOS: 1 day   Deborah Rice 03/02/2019, 6:32 PM

## 2019-03-02 NOTE — Procedures (Signed)
Post-Placental Nexplanon Insertion Procedure Note  Patient was identified. Informed consent was signed, signed copy in chart. A time-out was performed.    The insertion site was identified 8-10 cm (3-4 inches) from the medial epicondyle of the humerus and 3-5 cm (1.25-2 inches) posterior to (below) the sulcus (groove) between the biceps and triceps muscles of the patient's left arm and marked. The site was prepped and draped in the usual sterile fashion. Pt was prepped with alcohol swab and then injected with 3 cc of 1% lidocaine. The site was prepped with betadine. Nexplanon removed form packaging,  Device confirmed in needle, then inserted full length of needle and withdrawn per handbook instructions. Provider and patient verified presence of the implant in the woman's arm by palpation. Pt insertion site was covered with steristrips/adhesive bandage and pressure bandage. There was minimal blood loss. Patient tolerated procedure well.  Patient was given post procedure instructions and Nexplanon user card with expiration date. Condoms were recommended for STI prevention. Patient was asked to keep the pressure dressing on for 24 hours to minimize bruising and keep the adhesive bandage on for 3-5 days. The patient verbalized understanding of the plan of care and agrees.   Lot # [id number] Expiration Date 05/27/2021  Barrington Ellison, MD Surgical Obstetric Fellow  Faculty Practice

## 2019-03-03 MED ORDER — IBUPROFEN 600 MG PO TABS: 600.0000 mg | ORAL_TABLET | Freq: Four times a day (QID) | ORAL | 2 refills | Status: DC | PRN

## 2019-03-03 MED ORDER — SENNOSIDES-DOCUSATE SODIUM 8.6-50 MG PO TABS: 2.0000 | ORAL_TABLET | Freq: Every evening | ORAL | 2 refills | Status: DC | PRN

## 2019-03-03 NOTE — Discharge Summary (Signed)
Obstetrics Discharge Summary OB/GYN Faculty Practice   Patient Name: Deborah Rice DOB: Mar 25, 1996 MRN: [id number]  Date of admission: 03/01/2019 Delivering MD: Arrie Senate   Date of discharge: 03/03/2019  Admitting diagnosis: ctx 58min Intrauterine pregnancy: [redacted]w[redacted]d     Secondary diagnosis:   Active Problems:   Indication for care in labor or delivery   Labor without complication   Additional problems:  . Low TSH during pregnancy--f/u @6wk  postpartum visit     Discharge diagnosis: Term Pregnancy Delivered                                            Postpartum procedures: Nexplanon insertion  Complications: none  Outpatient Follow-Up: [ ] 4 weeks  Hospital course: Deborah Rice is a 23 y.o. [redacted]w[redacted]d who was admitted for active labor. Her pregnancy was uncomplicated. Her labor course was uncomplicated. Delivery was uncomplicated. Please see delivery/op note for additional details. Her postpartum course was uncomplicated. She was bottle feeding without difficulty. By day of discharge, she was passing flatus, urinating, eating and drinking without difficulty. Her pain was well-controlled, and she was discharged home with ibuprofen/tylenol. She will follow-up in clinic in 4 weeks.   Physical exam  Vitals:   03/02/19 0825 03/02/19 1245 03/02/19 2130 03/03/19 0600  BP: 107/61 111/64 116/71 (!) 105/57  Pulse: (!) 56 68 72 70  Resp: 18 18 18 18   Temp: 98.1 F (36.7 C) 98 F (36.7 C) 98.4 F (36.9 C) 98 F (36.7 C)  TempSrc: Oral Oral Oral   SpO2:   100% 98%   General: well appearing, NAD Lochia: appropriate Uterine Fundus: firm Incision: N/A DVT Evaluation: No evidence of DVT seen on physical exam. Labs: Lab Results  Component Value Date   WBC 13.5 (H) 03/01/2019   HGB 12.3 03/01/2019   HCT 38.5 03/01/2019   MCV 86.9 03/01/2019   PLT 198 03/01/2019   CMP Latest Ref Rng & Units 08/23/2018  Glucose 65 - 99 mg/dL 93  BUN 6 - 20 mg/dL 7  Creatinine 0.57 - 1.00 mg/dL 0.63   Sodium 134 - 144 mmol/L 138  Potassium 3.5 - 5.2 mmol/L 3.9  Chloride 96 - 106 mmol/L 105  CO2 20 - 29 mmol/L 18(L)  Calcium 8.7 - 10.2 mg/dL 9.6  Total Protein 6.0 - 8.5 g/dL 6.5  Total Bilirubin 0.0 - 1.2 mg/dL 0.5  Alkaline Phos 39 - 117 IU/L 48  AST 0 - 40 IU/L 10  ALT 0 - 32 IU/L 10    Discharge instructions: Per After Visit Summary and "Baby and Me Booklet"  After visit meds:  Allergies as of 03/03/2019   No Known Allergies     Medication List    STOP taking these medications   omeprazole 20 MG tablet Commonly known as: PriLOSEC OTC     TAKE these medications   ibuprofen 600 MG tablet Commonly known as: ADVIL Take 1 tablet (600 mg total) by mouth every 6 (six) hours as needed for headache, mild pain, moderate pain or cramping.   PrePLUS 27-1 MG Tabs Take 1 tablet by mouth daily.   senna-docusate 8.6-50 MG tablet Commonly known as: Senokot-S Take 2 tablets by mouth at bedtime as needed for mild constipation or moderate constipation.       Postpartum contraception: Nexplanon Diet: Routine Diet Activity: Advance as tolerated. Pelvic rest for 6 weeks.   Follow-up Appt:  Future Appointments  Date Time Provider [place]  03/29/2019  1:15 PM Darlina Rumpf, CNM [facility]   Newborn Data: Live born female  Birth Weight: 7 lb 13.8 oz (3565 g) APGAR: 9, 9  Newborn Delivery   Birth date/time: 03/01/2019 20:40:00 Delivery type: Vaginal, Spontaneous      Baby Feeding: Bottle Disposition:home with mother   Corliss Blacker, PGY-III Family Medicine   Attestation of Attending Supervision of Resident: Evaluation and management procedures were performed by the [facility] Medicine Resident under my supervision. I was immediately available for direct supervision, assistance and direction throughout this encounter.  I also confirm that I have verified the information documented in the resident's note, and that I have also personally reperformed the  pertinent components of the physical exam and all of the medical decision making activities.    Verita Schneiders, MD, [place] Attending [facility], [facility] for [employer], [place]

## 2019-03-07 ENCOUNTER — Encounter: Payer: Medicaid Other | Admitting: Family Medicine

## 2019-03-09 ENCOUNTER — Other Ambulatory Visit (HOSPITAL_COMMUNITY): Payer: Medicaid Other

## 2019-03-10 ENCOUNTER — Encounter (HOSPITAL_COMMUNITY): Payer: Self-pay | Admitting: Anesthesiology

## 2019-03-10 ENCOUNTER — Inpatient Hospital Stay (HOSPITAL_COMMUNITY): Payer: Medicaid Other

## 2019-03-10 ENCOUNTER — Inpatient Hospital Stay (HOSPITAL_COMMUNITY)
Admission: AD | Admit: 2019-03-10 | Discharge: 2019-03-10 | Disposition: A | Discharge status: Home / Self Care | Payer: Medicaid Other | Attending: Obstetrics & Gynecology | Admitting: Obstetrics & Gynecology

## 2019-03-10 ENCOUNTER — Encounter (HOSPITAL_COMMUNITY): Payer: Self-pay | Admitting: *Deleted

## 2019-03-10 ENCOUNTER — Other Ambulatory Visit: Payer: Self-pay

## 2019-03-10 DIAGNOSIS — Z20828 Contact with and (suspected) exposure to other viral communicable diseases: Secondary | ICD-10-CM | POA: Insufficient documentation

## 2019-03-10 DIAGNOSIS — N939 Abnormal uterine and vaginal bleeding, unspecified: Secondary | ICD-10-CM | POA: Diagnosis not present

## 2019-03-10 LAB — CBC
HCT: 36.3 % (ref 36.0–46.0)
Hemoglobin: 11.8 g/dL — ABNORMAL LOW (ref 12.0–15.0)
MCH: 27.9 pg (ref 26.0–34.0)
MCHC: 32.5 g/dL (ref 30.0–36.0)
MCV: 85.8 fL (ref 80.0–100.0)
Platelets: 308 10*3/uL (ref 150–400)
RBC: 4.23 MIL/uL (ref 3.87–5.11)
RDW: 13 % (ref 11.5–15.5)
WBC: 13 10*3/uL — ABNORMAL HIGH (ref 4.0–10.5)
nRBC: 0 % (ref 0.0–0.2)

## 2019-03-10 LAB — URINALYSIS, ROUTINE W REFLEX MICROSCOPIC
Bilirubin Urine: NEGATIVE
Glucose, UA: NEGATIVE mg/dL
Ketones, ur: NEGATIVE mg/dL
Nitrite: NEGATIVE
Protein, ur: 30 mg/dL — AB
RBC / HPF: >50 RBC/hpf — ABNORMAL HIGH (ref 0–5)
Specific Gravity, Urine: 1.021 (ref 1.005–1.030)
WBC, UA: >50 WBC/hpf — ABNORMAL HIGH (ref 0–5)
pH: 6 (ref 5.0–8.0)

## 2019-03-10 LAB — SARS CORONAVIRUS 2 BY RT PCR (HOSPITAL ORDER, PERFORMED IN ~~LOC~~ HOSPITAL LAB): SARS Coronavirus 2: NEGATIVE

## 2019-03-10 MED ORDER — DOXYCYCLINE HYCLATE 100 MG IV SOLR: 200.0000 mg | INTRAVENOUS | Status: DC

## 2019-03-10 MED ORDER — KETOROLAC TROMETHAMINE 30 MG/ML IJ SOLN
30.0000 mg | Freq: Once | INTRAMUSCULAR | Status: AC
  Administered 2019-03-10: 30 mg via INTRAMUSCULAR
  Filled 2019-03-10: qty 1

## 2019-03-10 NOTE — MAU Note (Signed)
.   Deborah Rice is a 23 y.o. at Unknown here in MAU reporting: that she delivered vaginally on August the 5th and since she went home she is having heavy vaginal bleeding with clots and lower abdominal cramping  Onset of complaint: 4 days Pain score: 5 Vitals:   03/10/19 1527  BP: 126/76  Pulse: 76  Resp: 16  Temp: 98.4 F (36.9 C)  SpO2: 100%      Lab orders placed from triage: UA

## 2019-03-10 NOTE — MAU Provider Note (Addendum)
History     CSN: [id number]  Arrival date and time: 03/10/19 1446   First Provider Initiated Contact with Patient 03/10/19 1604      Chief Complaint  Patient presents with  . Vaginal Bleeding  . Abdominal Pain   23 y.o. G2P2 s/p SVD 9 days ago presenting with passing clots and and pain. Reports onset of sx 5 days ago. Reports passing golf ball sized clots 3-4 times a day. VB is small to moderate, she is not soaking pads. Pain is central in lower abdomen and describes as cramping and constant. She take two 600mg  Ibuprofen at a time and gets temporary relief. Denies fevers. Has not placed anything in vagina. Reports burning with urination since delivery, denies other urinary sx.    OB History    Gravida  2   Para  2   Term  2   Preterm      AB      Living  2     SAB      TAB      Ectopic      Multiple  0   Live Births  2           Past Medical History:  Diagnosis Date  . Asthma     Past Surgical History:  Procedure Laterality Date  . WISDOM TOOTH EXTRACTION  2014    Family History  Problem Relation Age of Onset  . Asthma Sister   . Asthma Brother   . Diabetes Maternal Grandmother     Social History   Tobacco Use  . Smoking status: Never Smoker  . Smokeless tobacco: Never Used  Substance Use Topics  . Alcohol use: Not Currently  . Drug use: Not Currently    Allergies: No Known Allergies  Medications Prior to Admission  Medication Sig Dispense Refill Last Dose  . ibuprofen (ADVIL) 600 MG tablet Take 1 tablet (600 mg total) by mouth every 6 (six) hours as needed for headache, mild pain, moderate pain or cramping. 30 tablet 2 03/10/2019 at Unknown time  . Prenatal Vit-Fe Fumarate-FA (PREPLUS) 27-1 MG TABS Take 1 tablet by mouth daily. 30 tablet 13 03/10/2019 at Unknown time  . senna-docusate (SENOKOT-S) 8.6-50 MG tablet Take 2 tablets by mouth at bedtime as needed for mild constipation or moderate constipation. 30 tablet 2 03/09/2019 at Unknown  time    Review of Systems  Constitutional: Negative for chills and fever.  Gastrointestinal: Positive for abdominal pain.  Genitourinary: Positive for dysuria and vaginal bleeding. Negative for frequency, hematuria and urgency.   Physical Exam   Blood pressure 126/76, pulse 76, temperature 98.4 F (36.9 C), resp. rate 16, SpO2 100 %, unknown if currently breastfeeding.  Physical Exam  Nursing note and vitals reviewed. Constitutional: She is oriented to person, place, and time. She appears well-developed and well-nourished. No distress.  HENT:  Head: Normocephalic and atraumatic.  Neck: Normal range of motion.  Cardiovascular: Normal rate.  Respiratory: Effort normal. No respiratory distress.  GI: Soft. She exhibits no distension and no mass. There is no abdominal tenderness. There is no rebound and no guarding.  Genitourinary:    Genitourinary Comments: External: no lesions or erythema Vagina: rugated, pink, moist, scant bloody discharge Uterus: + enlarged, anteverted, + tender, no CMT Adnexae: no masses, no tenderness left, no tenderness right Cervix closed    Musculoskeletal: Normal range of motion.  Neurological: She is alert and oriented to person, place, and time.  Skin: Skin is warm and  dry.  Psychiatric: She has a normal mood and affect.   Results for orders placed or performed during the hospital encounter of 03/10/19 (from the past 24 hour(s))  Urinalysis, Routine w reflex microscopic     Status: Abnormal   Collection Time: 03/10/19  3:42 PM  Result Value Ref Range   Color, Urine YELLOW YELLOW   APPearance HAZY (A) CLEAR   Specific Gravity, Urine 1.021 1.005 - 1.030   pH 6.0 5.0 - 8.0   Glucose, UA NEGATIVE NEGATIVE mg/dL   Hgb urine dipstick LARGE (A) NEGATIVE   Bilirubin Urine NEGATIVE NEGATIVE   Ketones, ur NEGATIVE NEGATIVE mg/dL   Protein, ur 30 (A) NEGATIVE mg/dL   Nitrite NEGATIVE NEGATIVE   Leukocytes,Ua LARGE (A) NEGATIVE   RBC / HPF >50 (H) 0 - 5  RBC/hpf   WBC, UA >50 (H) 0 - 5 WBC/hpf   Bacteria, UA FEW (A) NONE SEEN   Squamous Epithelial / LPF 0-5 0 - 5   Mucus PRESENT   CBC     Status: Abnormal   Collection Time: 03/10/19  5:34 PM  Result Value Ref Range   WBC 13.0 (H) 4.0 - 10.5 K/uL   RBC 4.23 3.87 - 5.11 MIL/uL   Hemoglobin 11.8 (L) 12.0 - 15.0 g/dL   HCT 36.3 36.0 - 46.0 %   MCV 85.8 80.0 - 100.0 fL   MCH 27.9 26.0 - 34.0 pg   MCHC 32.5 30.0 - 36.0 g/dL   RDW 13.0 11.5 - 15.5 %   Platelets 308 150 - 400 K/uL   nRBC 0.0 0.0 - 0.2 %   US Pelvis Complete  Result Date: 03/10/2019 CLINICAL DATA:  Postpartum vaginal bleeding EXAM: TRANSABDOMINAL ULTRASOUND OF PELVIS DOPPLER ULTRASOUND OF OVARIES TECHNIQUE: Transabdominal ultrasound examination of the pelvis was performed including evaluation of the uterus, ovaries, adnexal regions, and pelvic cul-de-sac. Color and duplex Doppler ultrasound was utilized to evaluate blood flow to the ovaries. COMPARISON:  None. FINDINGS: Uterus Measurements: 13.1 x 8.1 x9.1 cm = volume: 503 mL. The uterus appears heterogeneous with increased vascularity seen within the myometrium. Endometrium Thickness: Diffusely heterogeneous and thickened possibly measuring 3 cm. There does appear to be vascular flow seen within the endometrium. Right ovary Measurements: 2.8 x 1.9 x 5.1 cm = volume: 14.3 mL. Normal appearance/no adnexal mass. Left ovary Measurements: 5.1 x 2.5 x 2.4 cm = volume: 16.0 mL. Normal appearance/no adnexal mass. Pulsed Doppler evaluation demonstrates normal low-resistance arterial and venous waveforms in both ovaries. Other: No free fluid. IMPRESSION: Findings which could be suggestive of retained products of conception in the appropriate clinical setting, with a thickened vascular heterogeneous endometrium measuring 3.4 cm. Electronically Signed   By: Prudencio Pair M.D.   On: 03/10/2019 18:51   MAU Course  Procedures Orders Placed This Encounter  Procedures  . Urine culture    Standing  Status:   Standing    Number of Occurrences:   1  . US Pelvis Complete    Standing Status:   Standing    Number of Occurrences:   1    Order Specific Question:   Reason for Exam (SYMPTOM  OR DIAGNOSIS REQUIRED)    Answer:   postpartum VB  . Urinalysis, Routine w reflex microscopic    Standing Status:   Standing    Number of Occurrences:   1  . CBC    Standing Status:   Standing    Number of Occurrences:   1   Meds ordered  this encounter  Medications  . ketorolac (TORADOL) 30 MG/ML injection 30 mg   MDM Labs and [place] ordered and reviewed.  1915: Consult with Dr. Harolyn Rutherford regarding retained POCs. MD to determine POC. Pt made NPO.  Assessment and Plan  Retained products of conception Mngt per Dr. Tad Moore, CNM 03/10/2019, 8:41 PM   Reassessment (9:26 PM) -Dr. Harolyn Rutherford returns call and states patient to be discharged and return for procedure tomorrow. -Patient informed of POC and need to return to main hospital tomorrow at 0830 for 0900 surgery. -Instructed to quarantine at home and remain NPO after midnight. -CoVid Swab collected. -No questions or concerns.  Maryann Conners MSN, CNM 03/10/2019 9:27 PM

## 2019-03-10 NOTE — Discharge Instructions (Signed)
Dilation and Curettage or Vacuum Curettage  Dilation and curettage (D&C) and vacuum curettage are minor procedures. A D&C involves stretching (dilation) the cervix and scraping (curettage) the inside lining of the uterus (endometrium). During a D&C, tissue is gently scraped from the endometrium, starting from the top portion of the uterus down to the lowest part of the uterus (cervix). During a vacuum curettage, the lining and tissue in the uterus are removed with the use of gentle suction. Curettage may be performed to either diagnose or treat a problem. As a diagnostic procedure, curettage is performed to examine tissues from the uterus. A diagnostic curettage may be done if you have:  Irregular bleeding in the uterus.  Bleeding with the development of clots.  Spotting between menstrual periods.  Prolonged menstrual periods or other abnormal bleeding.  Bleeding after menopause.  No menstrual period (amenorrhea).  A change in size and shape of the uterus.  Abnormal endometrial cells discovered during a Pap test. As a treatment procedure, curettage may be performed for the following reasons:  Removal of an IUD (intrauterine device).  Removal of retained placenta after giving birth.  Abortion.  Miscarriage.  Removal of endometrial polyps.  Removal of uncommon types of noncancerous lumps (fibroids). Tell a health care provider about:  Any allergies you have, including allergies to prescribed medicine or latex.  All medicines you are taking, including vitamins, herbs, eye drops, creams, and over-the-counter medicines. This is especially important if you take any blood-thinning medicine. Bring a list of all of your medicines to your appointment.  Any problems you or family members have had with anesthetic medicines.  Any blood disorders you have.  Any surgeries you have had.  Your medical history and any medical conditions you have.  Whether you are pregnant or may be  pregnant.  Recent vaginal infections you have had.  Recent menstrual periods, bleeding problems you have had, and what form of birth control (contraception) you use. What are the risks? Generally, this is a safe procedure. However, problems may occur, including:  Infection.  Heavy vaginal bleeding.  Allergic reactions to medicines.  Damage to the cervix or other structures or organs.  Development of scar tissue (adhesions) inside the uterus, which can cause abnormal amounts of menstrual bleeding. This may make it harder to get pregnant in the future.  A hole (perforation) or puncture in the uterine wall. This is rare. What happens before the procedure? Staying hydrated Follow instructions from your health care provider about hydration, which may include:  Up to 2 hours before the procedure - you may continue to drink clear liquids, such as water, clear fruit juice, black coffee, and plain tea. Eating and drinking restrictions Follow instructions from your health care provider about eating and drinking, which may include:  8 hours before the procedure - stop eating heavy meals or foods such as meat, fried foods, or fatty foods.  6 hours before the procedure - stop eating light meals or foods, such as toast or cereal.  6 hours before the procedure - stop drinking milk or drinks that contain milk.  2 hours before the procedure - stop drinking clear liquids. If your health care provider told you to take your medicine(s) on the day of your procedure, take them with only a sip of water. Medicines  Ask your health care provider about: ? Changing or stopping your regular medicines. This is especially important if you are taking diabetes medicines or blood thinners. ? Taking medicines such as aspirin  and ibuprofen. These medicines can thin your blood. Do not take these medicines before your procedure if your health care provider instructs you not to.  You may be given antibiotic  medicine to help prevent infection. General instructions  For 24 hours before your procedure, do not: ? Douche. ? Use tampons. ? Use medicines, creams, or suppositories in the vagina. ? Have sexual intercourse.  You may be given a pregnancy test on the day of the procedure.  Plan to have someone take you home from the hospital or clinic.  You may have a blood or urine sample taken.  If you will be going home right after the procedure, plan to have someone with you for 24 hours. What happens during the procedure?  To reduce your risk of infection: ? Your health care team will wash or sanitize their hands. ? Your skin will be washed with soap.  An IV tube will be inserted into one of your veins.  You will be given one of the following: ? A medicine that numbs the area in and around the cervix (local anesthetic). ? A medicine to make you fall asleep (general anesthetic).  You will lie down on your back, with your feet in foot rests (stirrups).  The size and position of your uterus will be checked.  A lubricated instrument (speculum or Sims retractor) will be inserted into the back side of your vagina. The speculum will be used to hold apart the walls of your vagina so your health care provider can see your cervix.  A tool (tenaculum) will be attached to the lip of the cervix to stabilize it.  Your cervix will be softened and dilated. This may be done by: ? Taking a medicine. ? Having tapered dilators or thin rods (laminaria) or gradual widening instruments (tapered dilators) inserted into your cervix.  A small, sharp, curved instrument (curette) will be used to scrape a small amount of tissue or cells from the endometrium or cervical canal. In some cases, gentle suction is applied with the curette. The curette will then be removed. The cells will be taken to a lab for testing. The procedure may vary among health care providers and hospitals. What happens after the  procedure?  You may have mild cramping, backache, pain, and light bleeding or spotting. You may pass small blood clots from your vagina.  You may have to wear compression stockings. These stockings help to prevent blood clots and reduce swelling in your legs.  Your blood pressure, heart rate, breathing rate, and blood oxygen level will be monitored until the medicines you were given have worn off. Summary  Dilation and curettage (D&C) involves stretching (dilation) the cervix and scraping (curettage) the inside lining of the uterus (endometrium).  After the procedure, you may have mild cramping, backache, pain, and light bleeding or spotting. You may pass small blood clots from your vagina.  Plan to have someone take you home from the hospital or clinic. This information is not intended to replace advice given to you by your health care provider. Make sure you discuss any questions you have with your health care provider. Document Released: 07/13/2005 Document Revised: 06/25/2017 Document Reviewed: 03/29/2016 Elsevier Patient Education  2020 [employer].

## 2019-03-11 ENCOUNTER — Inpatient Hospital Stay (HOSPITAL_COMMUNITY): Admission: AD | Admit: 2019-03-11 | Payer: Medicaid Other | Source: Home / Self Care | Admitting: Family Medicine

## 2019-03-11 ENCOUNTER — Inpatient Hospital Stay (HOSPITAL_COMMUNITY): Payer: Medicaid Other | Admitting: Certified Registered"

## 2019-03-11 ENCOUNTER — Ambulatory Visit (HOSPITAL_COMMUNITY)
Admission: RE | Admit: 2019-03-11 | Discharge: 2019-03-11 | Disposition: A | Discharge status: Home / Self Care | Payer: Medicaid Other | Attending: Obstetrics and Gynecology | Admitting: Obstetrics and Gynecology

## 2019-03-11 ENCOUNTER — Inpatient Hospital Stay (HOSPITAL_COMMUNITY): Payer: Medicaid Other

## 2019-03-11 ENCOUNTER — Encounter (HOSPITAL_COMMUNITY)
Admission: RE | Disposition: A | Discharge status: Home / Self Care | Payer: Self-pay | Source: Home / Self Care | Attending: Obstetrics and Gynecology

## 2019-03-11 ENCOUNTER — Encounter (HOSPITAL_COMMUNITY): Payer: Self-pay | Admitting: Anesthesiology

## 2019-03-11 DIAGNOSIS — J45909 Unspecified asthma, uncomplicated: Secondary | ICD-10-CM | POA: Diagnosis not present

## 2019-03-11 DIAGNOSIS — O034 Incomplete spontaneous abortion without complication: Secondary | ICD-10-CM | POA: Diagnosis not present

## 2019-03-11 DIAGNOSIS — O9953 Diseases of the respiratory system complicating the puerperium: Secondary | ICD-10-CM | POA: Diagnosis not present

## 2019-03-11 HISTORY — PX: DILATION AND EVACUATION: SHX1459

## 2019-03-11 LAB — CBC
HCT: 38.1 % (ref 36.0–46.0)
Hemoglobin: 12.2 g/dL (ref 12.0–15.0)
MCH: 27.9 pg (ref 26.0–34.0)
MCHC: 32 g/dL (ref 30.0–36.0)
MCV: 87.2 fL (ref 80.0–100.0)
Platelets: 272 10*3/uL (ref 150–400)
RBC: 4.37 MIL/uL (ref 3.87–5.11)
RDW: 13.1 % (ref 11.5–15.5)
WBC: 8.3 10*3/uL (ref 4.0–10.5)
nRBC: 0 % (ref 0.0–0.2)

## 2019-03-11 LAB — HCG, QUANTITATIVE, PREGNANCY: hCG, Beta Chain, Quant, S: 30 m[IU]/mL — ABNORMAL HIGH

## 2019-03-11 LAB — URINE CULTURE: Culture: 6000 — AB

## 2019-03-11 SURGERY — DILATION AND EVACUATION, UTERUS
Anesthesia: Choice

## 2019-03-11 SURGERY — DILATION AND EVACUATION, UTERUS
Anesthesia: General

## 2019-03-11 MED ORDER — DOCUSATE SODIUM 100 MG PO CAPS: 100.0000 mg | ORAL_CAPSULE | Freq: Two times a day (BID) | ORAL | 2 refills | Status: DC | PRN

## 2019-03-11 MED ORDER — ONDANSETRON HCL 4 MG/2ML IJ SOLN
INTRAMUSCULAR | Status: DC | PRN
  Administered 2019-03-11: 4 mg via INTRAVENOUS

## 2019-03-11 MED ORDER — LIDOCAINE 2% (20 MG/ML) 5 ML SYRINGE
INTRAMUSCULAR | Status: DC | PRN
  Administered 2019-03-11: 60 mg via INTRAVENOUS

## 2019-03-11 MED ORDER — SODIUM CHLORIDE 0.9 % IR SOLN
Status: DC | PRN
  Administered 2019-03-11: 1000 mL

## 2019-03-11 MED ORDER — HYDROMORPHONE HCL 1 MG/ML IJ SOLN: 0.2500 mg | INTRAMUSCULAR | Status: DC | PRN

## 2019-03-11 MED ORDER — CHLOROPROCAINE HCL 1 % IJ SOLN
INTRAMUSCULAR | Status: DC | PRN
  Administered 2019-03-11: 10 mL

## 2019-03-11 MED ORDER — ACETAMINOPHEN 500 MG PO TABS
1000.0000 mg | ORAL_TABLET | Freq: Once | ORAL | Status: AC
  Administered 2019-03-11: 1000 mg via ORAL
  Filled 2019-03-11: qty 2

## 2019-03-11 MED ORDER — LACTATED RINGERS IV SOLN
INTRAVENOUS | Status: DC
  Administered 2019-03-11: 08:00:00 via INTRAVENOUS

## 2019-03-11 MED ORDER — OXYCODONE-ACETAMINOPHEN 5-325 MG PO TABS: 1.0000 | ORAL_TABLET | Freq: Four times a day (QID) | ORAL | 0 refills | Status: DC | PRN

## 2019-03-11 MED ORDER — MIDAZOLAM HCL 5 MG/5ML IJ SOLN
INTRAMUSCULAR | Status: DC | PRN
  Administered 2019-03-11: 2 mg via INTRAVENOUS

## 2019-03-11 MED ORDER — CHLOROPROCAINE HCL 1 % IJ SOLN
INTRAMUSCULAR | Status: AC
  Filled 2019-03-11: qty 30

## 2019-03-11 MED ORDER — FENTANYL CITRATE (PF) 100 MCG/2ML IJ SOLN
INTRAMUSCULAR | Status: DC | PRN
  Administered 2019-03-11: 50 ug via INTRAVENOUS

## 2019-03-11 MED ORDER — DOXYCYCLINE HYCLATE 100 MG IV SOLR
200.0000 mg | INTRAVENOUS | Status: AC
  Administered 2019-03-11: 200 mg via INTRAVENOUS
  Filled 2019-03-11: qty 200

## 2019-03-11 MED ORDER — PROPOFOL 10 MG/ML IV BOLUS
INTRAVENOUS | Status: DC | PRN
  Administered 2019-03-11: 150 mg via INTRAVENOUS

## 2019-03-11 MED ORDER — DEXAMETHASONE SODIUM PHOSPHATE 4 MG/ML IJ SOLN
INTRAMUSCULAR | Status: DC | PRN
  Administered 2019-03-11: 4 mg via INTRAVENOUS

## 2019-03-11 SURGICAL SUPPLY — 23 items
CATH ROBINSON RED A/P 16FR (CATHETERS) ×3 IMPLANT
DECANTER SPIKE VIAL GLASS SM (MISCELLANEOUS) ×3 IMPLANT
GLOVE BIOGEL PI IND STRL 6.5 (GLOVE) ×1 IMPLANT
GLOVE BIOGEL PI IND STRL 7.0 (GLOVE) ×1 IMPLANT
GLOVE BIOGEL PI INDICATOR 6.5 (GLOVE) ×2
GLOVE BIOGEL PI INDICATOR 7.0 (GLOVE) ×2
GLOVE SURG SS PI 6.0 STRL IVOR (GLOVE) ×3 IMPLANT
GOWN STRL REUS W/ TWL LRG LVL3 (GOWN DISPOSABLE) ×2 IMPLANT
GOWN STRL REUS W/TWL LRG LVL3 (GOWN DISPOSABLE) ×4
HOSE CONNECTING 18IN BERKELEY (TUBING) ×3 IMPLANT
KIT BERKELEY 1ST TRIMESTER 3/8 (MISCELLANEOUS) ×6 IMPLANT
NS IRRIG 1000ML POUR BTL (IV SOLUTION) ×3 IMPLANT
PACK VAGINAL MINOR WOMEN LF (CUSTOM PROCEDURE TRAY) ×3 IMPLANT
PAD OB MATERNITY 4.3X12.25 (PERSONAL CARE ITEMS) ×3 IMPLANT
SET BERKELEY SUCTION TUBING (SUCTIONS) ×3 IMPLANT
TOWEL GREEN STERILE FF (TOWEL DISPOSABLE) ×6 IMPLANT
UNDERPAD 30X30 (UNDERPADS AND DIAPERS) ×3 IMPLANT
VACURETTE 10 RIGID CVD (CANNULA) IMPLANT
VACURETTE 12 RIGID CVD (CANNULA) ×3 IMPLANT
VACURETTE 6 ASPIR F TIP BERK (CANNULA) IMPLANT
VACURETTE 7MM CVD STRL WRAP (CANNULA) IMPLANT
VACURETTE 8 RIGID CVD (CANNULA) IMPLANT
VACURETTE 9 RIGID CVD (CANNULA) IMPLANT

## 2019-03-11 NOTE — Anesthesia Procedure Notes (Signed)
Procedure Name: LMA Insertion Performed by: Scheryl Darter, CRNA Pre-anesthesia Checklist: Patient identified, Emergency Drugs available, Suction available and Patient being monitored Patient Re-evaluated:Patient Re-evaluated prior to induction Oxygen Delivery Method: Circle System Utilized Preoxygenation: Pre-oxygenation with 100% oxygen Induction Type: IV induction Ventilation: Mask ventilation without difficulty LMA: LMA inserted LMA Size: 4.0 Number of attempts: 1 Airway Equipment and Method: Bite block Placement Confirmation: positive ETCO2 Tube secured with: Tape Dental Injury: Teeth and Oropharynx as per pre-operative assessment

## 2019-03-11 NOTE — Anesthesia Preprocedure Evaluation (Addendum)
Anesthesia Evaluation  Patient identified by MRN, date of birth, ID band Patient awake    Reviewed: Allergy & Precautions, H&P , NPO status , Patient's Chart, lab work & pertinent test results  Airway Mallampati: II  TM Distance: >3 FB Neck ROM: Full    Dental no notable dental hx. (+) Teeth Intact, Dental Advisory Given   Pulmonary asthma ,    Pulmonary exam normal breath sounds clear to auscultation       Cardiovascular negative cardio ROS   Rhythm:Regular Rate:Normal     Neuro/Psych negative neurological ROS  negative psych ROS   GI/Hepatic negative GI ROS, Neg liver ROS,   Endo/Other  negative endocrine ROS  Renal/GU negative Renal ROS  negative genitourinary   Musculoskeletal   Abdominal   Peds  Hematology negative hematology ROS (+)   Anesthesia Other Findings   Reproductive/Obstetrics negative OB ROS                            Anesthesia Physical Anesthesia Plan  ASA: II  Anesthesia Plan: General   Post-op Pain Management:    Induction: Intravenous  PONV Risk Score and Plan: 4 or greater and Ondansetron, Dexamethasone and Midazolam  Airway Management Planned: LMA  Additional Equipment:   Intra-op Plan:   Post-operative Plan: Extubation in OR  Informed Consent: I have reviewed the patients History and Physical, chart, labs and discussed the procedure including the risks, benefits and alternatives for the proposed anesthesia with the patient or authorized representative who has indicated his/her understanding and acceptance.     Dental advisory given  Plan Discussed with: CRNA  Anesthesia Plan Comments:         Anesthesia Quick Evaluation

## 2019-03-11 NOTE — H&P (Signed)
Deborah Rice is a 23 y.o. female P2 presenting for scheduled dilatation and evacuation. Patient is s/p vaginal delivery 10 days ago and presented with persistent heavy vaginal bleeding on 03/10/19. Pelvic ultrasound performed demonstrated findings consistent with retained products of conception. Patient denies chest pain, shortness of breath, lightheadedness/dizziness.  OB History    Gravida  2   Para  2   Term  2   Preterm      AB      Living  2     SAB      TAB      Ectopic      Multiple  0   Live Births  2          Past Medical History:  Diagnosis Date  . Asthma    Past Surgical History:  Procedure Laterality Date  . WISDOM TOOTH EXTRACTION  2014   Family History: family history includes Asthma in her brother and sister; Diabetes in her maternal grandmother. Social History:  reports that she has never smoked. She has never used smokeless tobacco. She reports previous alcohol use. She reports previous drug use.  ROS  See pertinent in HPI History   Blood pressure 120/65, pulse 61, temperature 98.6 F (37 C), temperature source Oral, resp. rate 16, height 5\' 3"  (1.6 m), weight 92.1 kg, SpO2 100 %, not currently breastfeeding. Exam Physical Exam  GENERAL: Well-developed, well-nourished female in no acute distress.  HEENT: Normocephalic, atraumatic. Sclerae anicteric.  NECK: Supple. Normal thyroid.  LUNGS: Clear to auscultation bilaterally.  HEART: Regular rate and rhythm. BREASTS: Symmetric in size. No palpable masses or lymphadenopathy, skin changes, or nipple drainage. ABDOMEN: Soft, nontender, nondistended. No organomegaly. PELVIC: Deferred to OR EXTREMITIES: No cyanosis, clubbing, or edema, 2+ distal pulses.  US Pelvis Complete  Result Date: 03/10/2019 CLINICAL DATA:  Postpartum vaginal bleeding EXAM: TRANSABDOMINAL ULTRASOUND OF PELVIS DOPPLER ULTRASOUND OF OVARIES TECHNIQUE: Transabdominal ultrasound examination of the pelvis was performed including  evaluation of the uterus, ovaries, adnexal regions, and pelvic cul-de-sac. Color and duplex Doppler ultrasound was utilized to evaluate blood flow to the ovaries. COMPARISON:  None. FINDINGS: Uterus Measurements: 13.1 x 8.1 x9.1 cm = volume: 503 mL. The uterus appears heterogeneous with increased vascularity seen within the myometrium. Endometrium Thickness: Diffusely heterogeneous and thickened possibly measuring 3 cm. There does appear to be vascular flow seen within the endometrium. Right ovary Measurements: 2.8 x 1.9 x 5.1 cm = volume: 14.3 mL. Normal appearance/no adnexal mass. Left ovary Measurements: 5.1 x 2.5 x 2.4 cm = volume: 16.0 mL. Normal appearance/no adnexal mass. Pulsed Doppler evaluation demonstrates normal low-resistance arterial and venous waveforms in both ovaries. Other: No free fluid. IMPRESSION: Findings which could be suggestive of retained products of conception in the appropriate clinical setting, with a thickened vascular heterogeneous endometrium measuring 3.4 cm. Electronically Signed   By: Prudencio Pair M.D.   On: 03/10/2019 18:51     Assessment/Plan: 23 yo s/p vaginal delivery 10 days ago with retained products of conception here for dilatation and evacuation - Risks, benefits and alternatives were explained incluing but not limited to risks of bleeding, infection and damage to adjacent organs  - patient verbalized understanding and all questions were answered - discharge instructions were also explained  Yoceline Bazar 03/11/2019, 8:44 AM

## 2019-03-11 NOTE — Transfer of Care (Signed)
Immediate Anesthesia Transfer of Care Note  Patient: Deborah Rice  Procedure(s) Performed: DILATATION AND EVACUATION (N/A )  Patient Location: PACU  Anesthesia Type:General  Level of Consciousness: awake, alert , oriented and sedated  Airway & Oxygen Therapy: Patient Spontanous Breathing and Patient connected to nasal cannula oxygen  Post-op Assessment: Report given to RN, Post -op Vital signs reviewed and stable and Patient moving all extremities  Post vital signs: Reviewed and stable  Last Vitals:  Vitals Value Taken Time  BP 113/63 03/11/19 0940  Temp    Pulse 56 03/11/19 0943  Resp 0 03/11/19 0943  SpO2 97 % 03/11/19 0943  Vitals shown include unvalidated device data.  Last Pain:  Vitals:   03/11/19 0727  TempSrc: Oral  PainSc: 0-No pain      Patients Stated Pain Goal: 5 (50/09/38 1829)  Complications: No apparent anesthesia complications

## 2019-03-11 NOTE — Discharge Instructions (Signed)
Dilation and Curettage or Vacuum Curettage  Dilation and curettage (D&C) and vacuum curettage are minor procedures. A D&C involves stretching (dilation) the cervix and scraping (curettage) the inside lining of the uterus (endometrium). During a D&C, tissue is gently scraped from the endometrium, starting from the top portion of the uterus down to the lowest part of the uterus (cervix). During a vacuum curettage, the lining and tissue in the uterus are removed with the use of gentle suction. Curettage may be performed to either diagnose or treat a problem. As a diagnostic procedure, curettage is performed to examine tissues from the uterus. A diagnostic curettage may be done if you have:  Irregular bleeding in the uterus.  Bleeding with the development of clots.  Spotting between menstrual periods.  Prolonged menstrual periods or other abnormal bleeding.  Bleeding after menopause.  No menstrual period (amenorrhea).  A change in size and shape of the uterus.  Abnormal endometrial cells discovered during a Pap test. As a treatment procedure, curettage may be performed for the following reasons:  Removal of an IUD (intrauterine device).  Removal of retained placenta after giving birth.  Abortion.  Miscarriage.  Removal of endometrial polyps.  Removal of uncommon types of noncancerous lumps (fibroids). Tell a health care provider about:  Any allergies you have, including allergies to prescribed medicine or latex.  All medicines you are taking, including vitamins, herbs, eye drops, creams, and over-the-counter medicines. This is especially important if you take any blood-thinning medicine. Bring a list of all of your medicines to your appointment.  Any problems you or family members have had with anesthetic medicines.  Any blood disorders you have.  Any surgeries you have had.  Your medical history and any medical conditions you have.  Whether you are pregnant or may be  pregnant.  Recent vaginal infections you have had.  Recent menstrual periods, bleeding problems you have had, and what form of birth control (contraception) you use. What are the risks? Generally, this is a safe procedure. However, problems may occur, including:  Infection.  Heavy vaginal bleeding.  Allergic reactions to medicines.  Damage to the cervix or other structures or organs.  Development of scar tissue (adhesions) inside the uterus, which can cause abnormal amounts of menstrual bleeding. This may make it harder to get pregnant in the future.  A hole (perforation) or puncture in the uterine wall. This is rare. What happens before the procedure? Staying hydrated Follow instructions from your health care provider about hydration, which may include:  Up to 2 hours before the procedure - you may continue to drink clear liquids, such as water, clear fruit juice, black coffee, and plain tea. Eating and drinking restrictions Follow instructions from your health care provider about eating and drinking, which may include:  8 hours before the procedure - stop eating heavy meals or foods such as meat, fried foods, or fatty foods.  6 hours before the procedure - stop eating light meals or foods, such as toast or cereal.  6 hours before the procedure - stop drinking milk or drinks that contain milk.  2 hours before the procedure - stop drinking clear liquids. If your health care provider told you to take your medicine(s) on the day of your procedure, take them with only a sip of water. Medicines  Ask your health care provider about: ? Changing or stopping your regular medicines. This is especially important if you are taking diabetes medicines or blood thinners. ? Taking medicines such as aspirin  and ibuprofen. These medicines can thin your blood. Do not take these medicines before your procedure if your health care provider instructs you not to.  You may be given antibiotic  medicine to help prevent infection. General instructions  For 24 hours before your procedure, do not: ? Douche. ? Use tampons. ? Use medicines, creams, or suppositories in the vagina. ? Have sexual intercourse.  You may be given a pregnancy test on the day of the procedure.  Plan to have someone take you home from the hospital or clinic.  You may have a blood or urine sample taken.  If you will be going home right after the procedure, plan to have someone with you for 24 hours. What happens during the procedure?  To reduce your risk of infection: ? Your health care team will wash or sanitize their hands. ? Your skin will be washed with soap.  An IV tube will be inserted into one of your veins.  You will be given one of the following: ? A medicine that numbs the area in and around the cervix (local anesthetic). ? A medicine to make you fall asleep (general anesthetic).  You will lie down on your back, with your feet in foot rests (stirrups).  The size and position of your uterus will be checked.  A lubricated instrument (speculum or Sims retractor) will be inserted into the back side of your vagina. The speculum will be used to hold apart the walls of your vagina so your health care provider can see your cervix.  A tool (tenaculum) will be attached to the lip of the cervix to stabilize it.  Your cervix will be softened and dilated. This may be done by: ? Taking a medicine. ? Having tapered dilators or thin rods (laminaria) or gradual widening instruments (tapered dilators) inserted into your cervix.  A small, sharp, curved instrument (curette) will be used to scrape a small amount of tissue or cells from the endometrium or cervical canal. In some cases, gentle suction is applied with the curette. The curette will then be removed. The cells will be taken to a lab for testing. The procedure may vary among health care providers and hospitals. What happens after the  procedure?  You may have mild cramping, backache, pain, and light bleeding or spotting. You may pass small blood clots from your vagina.  You may have to wear compression stockings. These stockings help to prevent blood clots and reduce swelling in your legs.  Your blood pressure, heart rate, breathing rate, and blood oxygen level will be monitored until the medicines you were given have worn off. Summary  Dilation and curettage (D&C) involves stretching (dilation) the cervix and scraping (curettage) the inside lining of the uterus (endometrium).  After the procedure, you may have mild cramping, backache, pain, and light bleeding or spotting. You may pass small blood clots from your vagina.  Plan to have someone take you home from the hospital or clinic. This information is not intended to replace advice given to you by your health care provider. Make sure you discuss any questions you have with your health care provider. Document Released: 07/13/2005 Document Revised: 06/25/2017 Document Reviewed: 03/29/2016 Elsevier Patient Education  2020 [employer].

## 2019-03-11 NOTE — Anesthesia Postprocedure Evaluation (Signed)
Anesthesia Post Note  Patient: Deborah Rice  Procedure(s) Performed: DILATATION AND EVACUATION (N/A )     Patient location during evaluation: PACU Anesthesia Type: General Level of consciousness: awake and alert Pain management: pain level controlled Vital Signs Assessment: post-procedure vital signs reviewed and stable Respiratory status: spontaneous breathing, nonlabored ventilation and respiratory function stable Cardiovascular status: blood pressure returned to baseline and stable Postop Assessment: no apparent nausea or vomiting Anesthetic complications: no    Last Vitals:  Vitals:   03/11/19 1040 03/11/19 1055  BP: 105/66 102/66  Pulse: (!) 47 (!) 47  Resp: 14   Temp: 36.8 C   SpO2: 99% 98%    Last Pain:  Vitals:   03/11/19 1040  TempSrc:   PainSc: 0-No pain                 Deetya Drouillard,W. EDMOND

## 2019-03-11 NOTE — Op Note (Signed)
Deborah Rice PROCEDURE DATE: 03/11/2019  PREOPERATIVE DIAGNOSIS: retained products of conception s/p vaginal delivery POSTOPERATIVE DIAGNOSIS: The same. PROCEDURE:     Dilation and Evacuation. SURGEON:  Dr. Mora Bellman  INDICATIONS: 23 y.o. [id number] with retained products of conceptions s/p vagional delivery 10 days ago,  needing surgical completion.  Risks of surgery were discussed with the patient including but not limited to: bleeding which may require transfusion; infection which may require antibiotics; injury to uterus or surrounding organs;need for additional procedures including laparotomy or laparoscopy; possibility of intrauterine scarring which may impair future fertility; and other postoperative/anesthesia complications. Written informed consent was obtained.    FINDINGS:  A 13-week size midline uterus, moderate amounts of products of conception, specimen sent to pathology.  ANESTHESIA:    Monitored intravenous sedation, paracervical block. INTRAVENOUS FLUIDS:  350 ml of LR ESTIMATED BLOOD LOSS:  Less than 20 ml. SPECIMENS:  Products of conception sent to pathology COMPLICATIONS:  None immediate.  PROCEDURE DETAILS:  The patient received intravenous antibiotics while in the preoperative area.  She was then taken to the operating room where general anesthesia was administered and was found to be adequate.  After an adequate timeout was performed, she was placed in the dorsal lithotomy position and examined; then prepped and draped in the sterile manner.   A vaginal speculum was then placed in the patient's vagina and a single tooth tenaculum was applied to the anterior lip of the cervix.  A paracervical block using 0.5% Marcaine was administered. The cervix was gently dilated to accommodate a 12 mm suction curette that was gently advanced to the uterine fundus.  The suction device was then activated and curette slowly rotated to clear the uterus of products of conception.  A sharp  curettage was then performed to confirm complete emptying of the uterus. There was minimal bleeding noted and the tenaculum removed with good hemostasis noted.   All instruments were removed from the patient's vagina. The patient tolerated the procedure well and was taken to the recovery area awake, and in stable condition.  The patient will be discharged to home as per PACU criteria.  Routine postoperative instructions given.  She was prescribed Percocet, Ibuprofen and Colace.  She will follow up in the clinic in 2 weeks for postoperative evaluation.

## 2019-03-12 ENCOUNTER — Encounter (HOSPITAL_COMMUNITY): Payer: Self-pay | Admitting: Obstetrics and Gynecology

## 2019-03-29 ENCOUNTER — Other Ambulatory Visit: Payer: Self-pay

## 2019-03-29 ENCOUNTER — Ambulatory Visit (INDEPENDENT_AMBULATORY_CARE_PROVIDER_SITE_OTHER): Payer: Medicaid Other | Admitting: Advanced Practice Midwife

## 2019-03-29 ENCOUNTER — Encounter: Payer: Self-pay | Admitting: Advanced Practice Midwife

## 2019-03-29 ENCOUNTER — Other Ambulatory Visit (HOSPITAL_COMMUNITY)
Admission: RE | Admit: 2019-03-29 | Discharge: 2019-03-29 | Disposition: A | Discharge status: Home / Self Care | Payer: Medicaid Other | Source: Ambulatory Visit | Attending: Advanced Practice Midwife | Admitting: Advanced Practice Midwife

## 2019-03-29 DIAGNOSIS — Z1389 Encounter for screening for other disorder: Secondary | ICD-10-CM

## 2019-03-29 DIAGNOSIS — Z30017 Encounter for initial prescription of implantable subdermal contraceptive: Secondary | ICD-10-CM | POA: Insufficient documentation

## 2019-03-29 NOTE — Patient Instructions (Signed)
Preventive Care 21-24 Years Old, Female Preventive care refers to visits with your health care provider and lifestyle choices that can promote health and wellness. This includes:  A yearly physical exam. This may also be called an annual well check.  Regular dental visits and eye exams.  Immunizations.  Screening for certain conditions.  Healthy lifestyle choices, such as eating a healthy diet, getting regular exercise, not using drugs or products that contain nicotine and tobacco, and limiting alcohol use. What can I expect for my preventive care visit? Physical exam Your health care provider will check your:  Height and weight. This may be used to calculate body mass index (BMI), which tells if you are at a healthy weight.  Heart rate and blood pressure.  Skin for abnormal spots. Counseling Your health care provider may ask you questions about your:  Alcohol, tobacco, and drug use.  Emotional well-being.  Home and relationship well-being.  Sexual activity.  Eating habits.  Work and work environment.  Method of birth control.  Menstrual cycle.  Pregnancy history. What immunizations do I need?  Influenza (flu) vaccine  This is recommended every year. Tetanus, diphtheria, and pertussis (Tdap) vaccine  You may need a Td booster every 10 years. Varicella (chickenpox) vaccine  You may need this if you have not been vaccinated. Human papillomavirus (HPV) vaccine  If recommended by your health care provider, you may need three doses over 6 months. Measles, mumps, and rubella (MMR) vaccine  You may need at least one dose of MMR. You may also need a second dose. Meningococcal conjugate (MenACWY) vaccine  One dose is recommended if you are age 19-21 years and a first-year college student living in a residence hall, or if you have one of several medical conditions. You may also need additional booster doses. Pneumococcal conjugate (PCV13) vaccine  You may need  this if you have certain conditions and were not previously vaccinated. Pneumococcal polysaccharide (PPSV23) vaccine  You may need one or two doses if you smoke cigarettes or if you have certain conditions. Hepatitis A vaccine  You may need this if you have certain conditions or if you travel or work in places where you may be exposed to hepatitis A. Hepatitis B vaccine  You may need this if you have certain conditions or if you travel or work in places where you may be exposed to hepatitis B. Haemophilus influenzae type b (Hib) vaccine  You may need this if you have certain conditions. You may receive vaccines as individual doses or as more than one vaccine together in one shot (combination vaccines). Talk with your health care provider about the risks and benefits of combination vaccines. What tests do I need?  Blood tests  Lipid and cholesterol levels. These may be checked every 5 years starting at age 20.  Hepatitis C test.  Hepatitis B test. Screening  Diabetes screening. This is done by checking your blood sugar (glucose) after you have not eaten for a while (fasting).  Sexually transmitted disease (STD) testing.  BRCA-related cancer screening. This may be done if you have a family history of breast, ovarian, tubal, or peritoneal cancers.  Pelvic exam and Pap test. This may be done every 3 years starting at age 21. Starting at age 30, this may be done every 5 years if you have a Pap test in combination with an HPV test. Talk with your health care provider about your test results, treatment options, and if necessary, the need for more tests.   Follow these instructions at home: Eating and drinking   Eat a diet that includes fresh fruits and vegetables, whole grains, lean protein, and low-fat dairy.  Take vitamin and mineral supplements as recommended by your health care provider.  Do not drink alcohol if: ? Your health care provider tells you not to drink. ? You are  pregnant, may be pregnant, or are planning to become pregnant.  If you drink alcohol: ? Limit how much you have to 0-1 drink a day. ? Be aware of how much alcohol is in your drink. In the [place] one drink equals one 12 oz bottle of beer (355 mL), one 5 oz glass of wine (148 mL), or one 1 oz glass of hard liquor (44 mL). Lifestyle  Take daily care of your teeth and gums.  Stay active. Exercise for at least 30 minutes on 5 or more days each week.  Do not use any products that contain nicotine or tobacco, such as cigarettes, e-cigarettes, and chewing tobacco. If you need help quitting, ask your health care provider.  If you are sexually active, practice safe sex. Use a condom or other form of birth control (contraception) in order to prevent pregnancy and STIs (sexually transmitted infections). If you plan to become pregnant, see your health care provider for a preconception visit. What's next?  Visit your health care provider once a year for a well check visit.  Ask your health care provider how often you should have your eyes and teeth checked.  Stay up to date on all vaccines. This information is not intended to replace advice given to you by your health care provider. Make sure you discuss any questions you have with your health care provider. Document Released: 09/08/2001 Document Revised: 03/24/2018 Document Reviewed: 03/24/2018 Elsevier Patient Education  2020 [employer]  

## 2019-03-29 NOTE — Progress Notes (Signed)
Post Partum Exam  Deborah Rice is a 23 y.o. G54P2002 female who presents for a postpartum visit. She is 4 weeks postpartum following a spontaneous vaginal delivery. I have fully reviewed the prenatal and intrapartum course. The delivery was at 39.4 gestational weeks.  Anesthesia: none. Postpartum course has been complictaed by retained products  . Baby's course has been uncomplicated. Baby is feeding by bottle - Deborah Rice. Bleeding staining only. Bowel function is normal. Bladder function is normal. Patient is not sexually active. Contraception method is Nexplanon. Postpartum depression screening:12  The following portions of the patient's history were reviewed and updated as appropriate: allergies, current medications, past family history, past medical history, past social history, past surgical history and problem list. Last pap smear done Jan 2017 and was Normal  Review of Systems A comprehensive review of systems was negative.    Objective:  Blood pressure 118/75, pulse 73, weight 193 lb (87.5 kg), not currently breastfeeding.  General:  alert, cooperative, appears stated age and no distress   Breasts:  inspection negative, no nipple discharge or bleeding, no masses or nodularity palpable  Lungs: clear to auscultation bilaterally  Heart:  regular rate and rhythm, S1, S2 normal, no murmur, click, rub or gallop  Abdomen: soft, non-tender; bowel sounds normal; no masses,  no organomegaly   Vulva:  normal  Vagina: normal vagina and scant bleeding   Cervix:  no bleeding following Pap, no cervical motion tenderness and no lesions  Corpus: not examined  Adnexa:  not evaluated  Rectal Exam: Not performed.       Nexplanon palpated in upper left arm  Assessment:    Normal postpartum exam. Pap smear done at today's visit.   Plan:   1. Contraception: Nexplanon 2. TSH panel collected today 3. Follow up in: 1 year or as needed.

## 2019-03-30 ENCOUNTER — Encounter: Payer: Self-pay | Admitting: Advanced Practice Midwife

## 2019-03-30 LAB — THYROID PANEL WITH TSH
Free Thyroxine Index: 2 (ref 1.2–4.9)
T3 Uptake Ratio: 26 % (ref 24–39)
T4, Total: 7.8 ug/dL (ref 4.5–12.0)
TSH: 0.942 u[IU]/mL (ref 0.450–4.500)

## 2019-03-30 LAB — CYTOLOGY - PAP: Diagnosis: NEGATIVE

## 2019-04-06 ENCOUNTER — Other Ambulatory Visit: Payer: Self-pay | Admitting: Advanced Practice Midwife

## 2019-04-06 ENCOUNTER — Encounter: Payer: Self-pay | Admitting: Advanced Practice Midwife

## 2019-04-06 ENCOUNTER — Other Ambulatory Visit: Payer: Self-pay

## 2019-04-06 ENCOUNTER — Telehealth (INDEPENDENT_AMBULATORY_CARE_PROVIDER_SITE_OTHER): Payer: Medicaid Other | Admitting: Clinical

## 2019-04-06 DIAGNOSIS — F4323 Adjustment disorder with mixed anxiety and depressed mood: Secondary | ICD-10-CM

## 2019-04-06 DIAGNOSIS — F329 Major depressive disorder, single episode, unspecified: Secondary | ICD-10-CM | POA: Insufficient documentation

## 2019-04-06 DIAGNOSIS — F419 Anxiety disorder, unspecified: Secondary | ICD-10-CM

## 2019-04-06 DIAGNOSIS — F32A Depression, unspecified: Secondary | ICD-10-CM | POA: Insufficient documentation

## 2019-04-06 HISTORY — DX: Anxiety disorder, unspecified: F41.9

## 2019-04-06 HISTORY — DX: Depression, unspecified: F32.A

## 2019-04-06 MED ORDER — SERTRALINE HCL 50 MG PO TABS: 50.0000 mg | ORAL_TABLET | Freq: Every day | ORAL | 2 refills | Status: DC

## 2019-04-06 NOTE — Progress Notes (Signed)
Anxiety and depression. PHQ score of 9, GAD score 10. S/p video visit with [facility], LCSW 04/06/19. New rx Zoloft. Medication teaching, prescription notification via active MyChart account.  Mallie Snooks, MSN, CNM Certified Nurse Midwife, [employer] for [employer], [place] [facility] 04/06/19 9:11 PM

## 2019-04-06 NOTE — BH Specialist Note (Signed)
Integrated Behavioral Health via Telemedicine Video Visit  04/06/2019 Deborah Rice [id number]  Number of [place] visits: 1 Session Start time: 1:18  Session End time: 1:48 Total time: 30 minutes  Referring Provider: Mallie Snooks, CNM Type of Visit: Video Patient/Family location: Home [facility] Provider location: [facility] All persons participating in visit: Patient Deborah Rice and Ashley and MSW Intern Audria Nine  Confirmed patient's address: Yes  Confirmed patient's phone number: Yes  Any changes to demographics: No   Confirmed patient's insurance: Yes  Any changes to patient's insurance: No   Discussed confidentiality: Yes   I connected with Deborah Rice and/or Deborah Rice a video enabled telemedicine application and verified that I am :with the correct person using two identifiers.     I discussed the limitations of evaluation and management by telemedicine and the availability of in person appointments.  I discussed that the purpose of this visit is to provide behavioral health care while limiting exposure to the novel coronavirus.   Discussed there is a possibility of technology failure and discussed alternative modes of communication if that failure occurs.  I discussed that engaging in this video visit, they consent to the provision of behavioral healthcare and the services will be billed under their insurance.  Patient and/or legal guardian expressed understanding and consented to video visit: Yes   PRESENTING CONCERNS: Patient and/or family reports the following symptoms/concerns: Pt states her primary concern today is feeling anhedonia, fatigue, worry, irritability, eating once/day and lack of quality sleep; copes by isolating herself.  Duration of problem: Postpartum; Severity of problem: moderate  STRENGTHS (Protective Factors/Coping Skills): Self-aware  GOALS ADDRESSED: Patient will: 1.  Reduce symptoms of: anxiety,  depression and stress  2.  Increase knowledge and/or ability of: healthy habits  3.  Demonstrate ability to: Increase healthy adjustment to current life circumstances, Increase adequate support systems for patient/family and Increase motivation to adhere to plan of care  INTERVENTIONS: Interventions utilized:  Sleep Hygiene, Psychoeducation and/or Health Education and Link to [employer] Standardized Assessments completed: GAD-7 and PHQ 9  ASSESSMENT: Patient currently experiencing Adjustment disorder with depression and anxiety.   Patient may benefit from psychoeducation and brief therapeutic interventions regarding coping with symptoms of depression and anxiety .  PLAN: 1. Follow up with behavioral health clinician on : One week 2. Behavioral recommendations:  -Begin taking BH medication, as prescribed -Begin using sleep sounds nightly for improved family sleep for one week -Register and attend at least one new mom support group via either [email or web address] or [email or web address] 3. Referral(s): [place] (In Clinic) and [employer]:  new mom support  I discussed the assessment and treatment plan with the patient and/or parent/guardian. They were provided an opportunity to ask questions and all were answered. They agreed with the plan and demonstrated an understanding of the instructions.   They were advised to call back or seek an in-person evaluation if the symptoms worsen or if the condition fails to improve as anticipated.  Caroleen Hamman McMannes  Depression screen [facility] 2/9 04/06/2019  Decreased Interest 3  Down, Depressed, Hopeless 1  PHQ - 2 Score 4  Altered sleeping 0  Tired, decreased energy 3  Change in appetite 1  Feeling bad or failure about yourself  1  Trouble concentrating 0  Moving slowly or fidgety/restless 0  Suicidal thoughts 0  PHQ-9 Score 9   GAD 7 : Generalized Anxiety Score 04/06/2019  Nervous, Anxious, on Edge 1   Control/stop worrying  1  Worry too much - different things 3  Trouble relaxing 2  Restless 0  Easily annoyed or irritable 3  Afraid - awful might happen 0  Total GAD 7 Score 10

## 2019-04-10 ENCOUNTER — Telehealth: Payer: Self-pay | Admitting: Clinical

## 2019-04-10 NOTE — Telephone Encounter (Signed)
Integrated Behavioral Health Medication Management Phone Note  MRN: [id number] NAME: Deborah Rice  Time Call Initiated: 10:57 Time Call Completed: 11:00 Total Call Time: 15 minutes  Current Medications:  Outpatient Medications Prior to Visit  Medication Sig Dispense Refill  . docusate sodium (COLACE) 100 MG capsule Take 1 capsule (100 mg total) by mouth 2 (two) times daily as needed. (Patient not taking: Reported on 03/29/2019) 30 capsule 2  . ibuprofen (ADVIL) 600 MG tablet Take 1 tablet (600 mg total) by mouth every 6 (six) hours as needed for headache, mild pain, moderate pain or cramping. (Patient not taking: Reported on 03/29/2019) 30 tablet 2  . oxyCODONE-acetaminophen (PERCOCET/ROXICET) 5-325 MG tablet Take 1 tablet by mouth every 6 (six) hours as needed. (Patient not taking: Reported on 03/29/2019) 10 tablet 0  . Prenatal Vit-Fe Fumarate-FA (PREPLUS) 27-1 MG TABS Take 1 tablet by mouth daily. (Patient not taking: Reported on 03/29/2019) 30 tablet 13  . senna-docusate (SENOKOT-S) 8.6-50 MG tablet Take 2 tablets by mouth at bedtime as needed for mild constipation or moderate constipation. (Patient not taking: Reported on 03/29/2019) 30 tablet 2  . sertraline (ZOLOFT) 50 MG tablet Take 1 tablet (50 mg total) by mouth daily. Take 1/2 a tablet for the first 7 days then take one whole tablet daily 30 tablet 2   No facility-administered medications prior to visit.     Patient has been able to get all medications filled as prescribed: Yes  Patient is currently taking all medications as prescribed: No, but plans to start taking   Patient reports experiencing side effects: N/A  Patient describes feeling this way on medications: N/A  Additional patient concerns: none at this time  Patient advised to schedule appointment with provider for evaluation of medication side effects or additional concerns: Yes- Pt agrees to video visit with [facility] on 04/18/19 at 1:15pm   Garlan Fair, LCSW

## 2019-04-18 ENCOUNTER — Other Ambulatory Visit: Payer: Self-pay

## 2019-04-18 ENCOUNTER — Ambulatory Visit (INDEPENDENT_AMBULATORY_CARE_PROVIDER_SITE_OTHER): Payer: Medicaid Other | Admitting: Clinical

## 2019-04-18 DIAGNOSIS — F4323 Adjustment disorder with mixed anxiety and depressed mood: Secondary | ICD-10-CM | POA: Diagnosis not present

## 2019-04-18 NOTE — BH Specialist Note (Signed)
Integrated Behavioral Health via Telemedicine Video Visit  04/18/2019 Deborah Rice [id number]  Number of [place] visits: 2 Session Start time: 1:16  Session End time: 1:32 Total time: 20 minutes  Referring Provider: Mallie Snooks, CNM Type of Visit: Video Patient/Family location: Home [facility] Provider location: [facility] All persons participating in visit: Patient Deborah Rice and [place]  Confirmed patient's address: Yes  Confirmed patient's phone number: Yes  Any changes to demographics: No   Confirmed patient's insurance: Yes  Any changes to patient's insurance: No   Discussed confidentiality: At previous visit  I connected with Deborah Rice by a video enabled telemedicine application and verified that I am speaking with the correct person using two identifiers.     I discussed the limitations of evaluation and management by telemedicine and the availability of in person appointments.  I discussed that the purpose of this visit is to provide behavioral health care while limiting exposure to the novel coronavirus.   Discussed there is a possibility of technology failure and discussed alternative modes of communication if that failure occurs.  I discussed that engaging in this video visit, they consent to the provision of behavioral healthcare and the services will be billed under their insurance.  Patient and/or legal guardian expressed understanding and consented to video visit: Yes   PRESENTING CONCERNS: Patient and/or family reports the following symptoms/concerns: Pt states that she took Zoloft at 25mg  for one week, and just started at 50mg , noticed a little sleepier at night, with slightly better sleep; pt felt better to get out of the house by herself one day this week. Pt felt nauseous and vomited once after beginning Zoloft, but nausea went away after two days.  Duration of problem: Postpartum; Severity of problem: moderate  STRENGTHS  (Protective Factors/Coping Skills): Self-awareness; positive outlook, and open to treatment  GOALS ADDRESSED: Patient will: 1.  Reduce symptoms of: anxiety, depression and stress  2.  Increase knowledge and/or ability of: healthy habits  3.  Demonstrate ability to: Increase healthy adjustment to current life circumstances  INTERVENTIONS: Interventions utilized:  Medication Monitoring and Psychoeducation and/or Health Education Standardized Assessments completed: Not Needed  ASSESSMENT: Patient currently experiencing Adjustment disorder with depression and anxiety  Patient may benefit from psychoeducation and brief therapeutic interventions regarding coping with symptoms of anxiety, depression, and life stress .  PLAN: 1. Follow up with behavioral health clinician on : Two weeks 2. Behavioral recommendations:  -Continue taking BH medication as prescribed -Plan at least one outing without children per week -Consider attending at least one new mom support group, as discussed -Continue using sleep sounds at night for as long as remains helpful 3. Referral(s): [place] (In Clinic)  I discussed the assessment and treatment plan with the patient and/or parent/guardian. They were provided an opportunity to ask questions and all were answered. They agreed with the plan and demonstrated an understanding of the instructions.   They were advised to call back or seek an in-person evaluation if the symptoms worsen or if the condition fails to improve as anticipated.  Deborah Rice  Depression screen [facility] 2/9 04/06/2019  Decreased Interest 3  Down, Depressed, Hopeless 1  PHQ - 2 Score 4  Altered sleeping 0  Tired, decreased energy 3  Change in appetite 1  Feeling bad or failure about yourself  1  Trouble concentrating 0  Moving slowly or fidgety/restless 0  Suicidal thoughts 0  PHQ-9 Score 9   GAD 7 : Generalized  Anxiety Score 04/06/2019  Nervous,  Anxious, on Edge 1  Control/stop worrying 1  Worry too much - different things 3  Trouble relaxing 2  Restless 0  Easily annoyed or irritable 3  Afraid - awful might happen 0  Total GAD 7 Score 10

## 2019-05-02 ENCOUNTER — Ambulatory Visit (INDEPENDENT_AMBULATORY_CARE_PROVIDER_SITE_OTHER): Payer: Medicaid Other | Admitting: Clinical

## 2019-05-02 ENCOUNTER — Other Ambulatory Visit: Payer: Self-pay

## 2019-05-02 DIAGNOSIS — F4323 Adjustment disorder with mixed anxiety and depressed mood: Secondary | ICD-10-CM | POA: Diagnosis not present

## 2019-05-02 NOTE — BH Specialist Note (Signed)
Integrated Behavioral Health via Telemedicine Video Visit  05/02/2019 Somara Hermsen [id number]  Number of [place] visits: 3 Session Start time: 1:46  Session End time: 2:19 Total time: 30 minutes  Referring Provider: Mallie Snooks, CNM Type of Visit: Video Patient/Family location: Home [facility] Provider location: [facility] All persons participating in visit: Patient Deborah Rice and [place]  Confirmed patient's address: Yes  Confirmed patient's phone number: Yes  Any changes to demographics: No   Confirmed patient's insurance: Yes  Any changes to patient's insurance: No   Discussed confidentiality: At previous visit  I connected with Bobbe Medico by a video enabled telemedicine application and verified that I am speaking with the correct person using two identifiers.     I discussed the limitations of evaluation and management by telemedicine and the availability of in person appointments.  I discussed that the purpose of this visit is to provide behavioral health care while limiting exposure to the novel coronavirus.   Discussed there is a possibility of technology failure and discussed alternative modes of communication if that failure occurs.  I discussed that engaging in this video visit, they consent to the provision of behavioral healthcare and the services will be billed under their insurance.  Patient and/or legal guardian expressed understanding and consented to video visit: Yes   PRESENTING CONCERNS: Patient and/or family reports the following symptoms/concerns: Pt states she is feeling slightly more anxious over the unknown work schedule of FOB, which has prevented her from going out of the house without the children; pt has noticed that she is coping better on Green medication; concerned she  does not have a PCP.  Duration of problem: Postpartum; Severity of problem: moderate  STRENGTHS (Protective Factors/Coping Skills): Self-awareness,  positive outlook; open to treatment  GOALS ADDRESSED: Patient will: 1.  Reduce symptoms of: anxiety, depression and stress  2.  Increase knowledge and/or ability of: coping skills and stress reduction  3.  Demonstrate ability to: Increase healthy adjustment to current life circumstances and Increase motivation to adhere to plan of care  INTERVENTIONS: Interventions utilized:  Brief CBT and Medication Monitoring Standardized Assessments completed: GAD-7 and PHQ 9  ASSESSMENT: Patient currently experiencing Adjustment disorder with mixed anxiety and depressed mood.   Patient may benefit from continued brief therapeutic interventions regarding coping with symptoms of anxiety and depression.  PLAN: 1. Follow up with behavioral health clinician on : Two week f/u call (to ensure initial PCP visit is scheduled for continuity of care) 2. Behavioral recommendations:  -Continue taking BH medication as prescribed -Continue using self-coping strategies that are helping to cope -[place] [facility] to set up initial PCP visit to establish care 3. Referral(s): [place] (In Clinic)  I discussed the assessment and treatment plan with the patient and/or parent/guardian. They were provided an opportunity to ask questions and all were answered. They agreed with the plan and demonstrated an understanding of the instructions.   They were advised to call back or seek an in-person evaluation if the symptoms worsen or if the condition fails to improve as anticipated.  Caroleen Hamman   Depression screen [facility] 2/9 05/02/2019 04/06/2019  Decreased Interest 1 3  Down, Depressed, Hopeless 1 1  PHQ - 2 Score 2 4  Altered sleeping 2 0  Tired, decreased energy 2 3  Change in appetite 3 1  Feeling bad or failure about yourself  1 1  Trouble concentrating 0 0  Moving slowly or fidgety/restless  0 0  Suicidal thoughts 0 0  PHQ-9 Score 10 9   GAD 7 :  Generalized Anxiety Score 05/02/2019 04/06/2019  Nervous, Anxious, on Edge 3 1  Control/stop worrying 1 1  Worry too much - different things 3 3  Trouble relaxing 1 2  Restless 0 0  Easily annoyed or irritable 3 3  Afraid - awful might happen 0 0  Total GAD 7 Score 11 10

## 2019-05-19 ENCOUNTER — Telehealth: Payer: Self-pay | Admitting: Clinical

## 2019-05-19 NOTE — Telephone Encounter (Signed)
Attempt to follow up with patient to confirm she has initial appointment for PCP; Left HIPPA-compliant message to call back Roselyn Reef from [facility] [employer] at [phone].

## 2019-07-04 ENCOUNTER — Ambulatory Visit: Payer: Medicaid Other | Admitting: Podiatry

## 2019-07-12 ENCOUNTER — Other Ambulatory Visit: Payer: Self-pay

## 2019-07-12 ENCOUNTER — Ambulatory Visit: Payer: Medicaid Other | Admitting: Podiatry

## 2019-07-12 DIAGNOSIS — D492 Neoplasm of unspecified behavior of bone, soft tissue, and skin: Secondary | ICD-10-CM

## 2019-07-12 DIAGNOSIS — B070 Plantar wart: Secondary | ICD-10-CM

## 2019-07-12 MED ORDER — CIMETIDINE 400 MG PO TABS: 400.0000 mg | ORAL_TABLET | Freq: Two times a day (BID) | ORAL | 1 refills | Status: DC

## 2019-07-17 NOTE — Progress Notes (Signed)
   Subjective: 23 y.o. female presenting today as a new patient with a chief complaint of intermittent pain secondary to plantar warts on the 1st and 2nd toes of the right foot. Walking and wearing certain shoes increases the pain. She has had cryotherapy for treatment. Patient is here for further evaluation and treatment.    Past Medical History:  Diagnosis Date  . Asthma   . Low TSH level 09/10/2018   Normal 03/29/2019   Neg rpt at 28wks Normal fT4    Objective: Physical Exam General: The patient is alert and oriented x3 in no acute distress.   Dermatology: Hyperkeratotic skin lesion(s) noted to the plantar aspect of the right foot approximately 1 cm in diameter. Pinpoint bleeding noted upon debridement. Skin is warm, dry and supple bilateral lower extremities. Negative for open lesions or macerations.   Vascular: Palpable pedal pulses bilaterally. No edema or erythema noted. Capillary refill within normal limits.   Neurological: Epicritic and protective threshold grossly intact bilaterally.    Musculoskeletal Exam: Pain on palpation to the noted skin lesion(s).  Range of motion within normal limits to all pedal and ankle joints bilateral. Muscle strength 5/5 in all groups bilateral.    Assessment: #1 plantar wart right 1st and 2nd - multiple      Plan of Care:  #1 Patient was evaluated. #2 Excisional debridement of the plantar wart lesion(s) was performed using a chisel blade. Cantharone was applied and the lesion(s) was dressed with a dry sterile dressing. #3 Prescription for Cimetidine 400 mg BID x 30 days provided to patient.  #4 patient is to return to clinic in 3 weeks.     Edrick Kins, DPM [facility]  Dr. Edrick Kins, [address]                                        [place], [place] [address]                Office [phone]  Fax [phone]

## 2019-08-07 ENCOUNTER — Ambulatory Visit: Payer: Medicaid Other | Admitting: Podiatry

## 2019-08-07 ENCOUNTER — Other Ambulatory Visit: Payer: Self-pay

## 2019-08-07 DIAGNOSIS — B070 Plantar wart: Secondary | ICD-10-CM

## 2019-08-07 DIAGNOSIS — D492 Neoplasm of unspecified behavior of bone, soft tissue, and skin: Secondary | ICD-10-CM | POA: Diagnosis not present

## 2019-08-10 NOTE — Progress Notes (Signed)
   Subjective: 24 y.o. female presenting today for follow up evaluation of multiple plantar warts of the 1st and 2nd toes of the right foot. She states the areas have improved and is no longer having any pain from them. She has been taking the Cimetidine as directed for treatment. There are no aggravating factors noted. Patient is here for further evaluation and treatment.    Past Medical History:  Diagnosis Date  . Asthma   . Low TSH level 09/10/2018   Normal 03/29/2019   Neg rpt at 28wks Normal fT4    Objective: Physical Exam General: The patient is alert and oriented x3 in no acute distress.   Dermatology: Hyperkeratotic skin lesion(s) noted to the plantar aspect of the right foot approximately 1 cm in diameter. Pinpoint bleeding noted upon debridement. Skin is warm, dry and supple bilateral lower extremities. Negative for open lesions or macerations.   Vascular: Palpable pedal pulses bilaterally. No edema or erythema noted. Capillary refill within normal limits.   Neurological: Epicritic and protective threshold grossly intact bilaterally.    Musculoskeletal Exam: Pain on palpation to the noted skin lesion(s).  Range of motion within normal limits to all pedal and ankle joints bilateral. Muscle strength 5/5 in all groups bilateral.    Assessment: #1 plantar wart right 1st and 2nd - multiple - resolved      Plan of Care:  #1 Patient was evaluated. #2 Light debridement of the plantar wart lesion(s) was performed using a tissue nipper.  #3 Recommended OTC wart remover for three weeks.   #4 patient is to return to clinic as needed.     Edrick Kins, DPM [facility]  Dr. Edrick Kins, [address]                                        [place], [place] [address]                Office [phone]  Fax [phone]

## 2019-11-24 DIAGNOSIS — Z20828 Contact with and (suspected) exposure to other viral communicable diseases: Secondary | ICD-10-CM | POA: Diagnosis not present

## 2019-11-24 DIAGNOSIS — Z03818 Encounter for observation for suspected exposure to other biological agents ruled out: Secondary | ICD-10-CM | POA: Diagnosis not present

## 2019-12-19 ENCOUNTER — Other Ambulatory Visit: Payer: Self-pay

## 2019-12-19 MED ORDER — CIMETIDINE 400 MG PO TABS: 400.0000 mg | ORAL_TABLET | Freq: Two times a day (BID) | ORAL | 1 refills | Status: DC

## 2019-12-20 ENCOUNTER — Ambulatory Visit: Payer: Medicaid Other | Attending: Internal Medicine

## 2019-12-31 IMAGING — US US MFM OB COMP + 14 WK
1 series · 13 of 28 positions shown · non-contrast
Comparison: none

[Series 1: us mfm ob comp + 14 wk · 81 acquisitions, 13 frames shown]
[im 3/81]
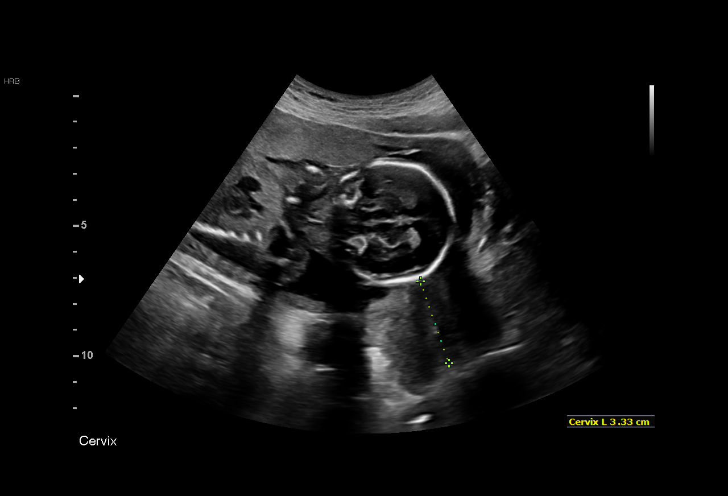
[im 9/81]
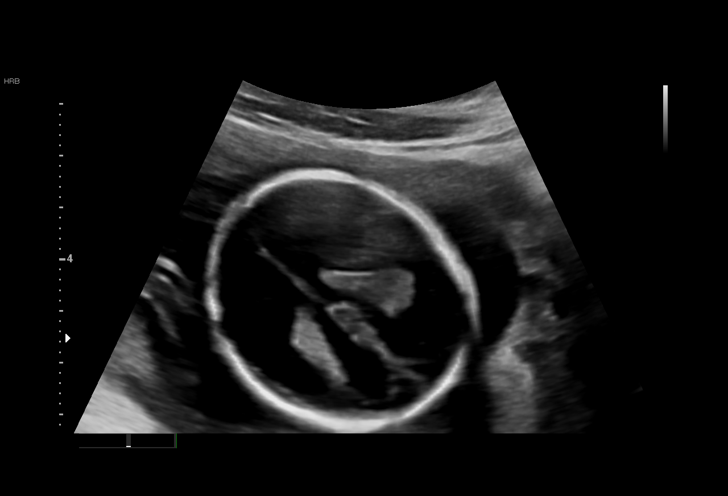
[im 15/81]
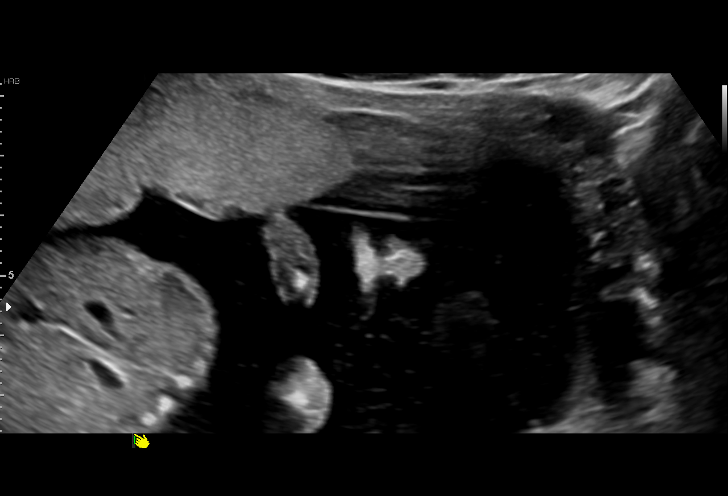
[im 21/81]
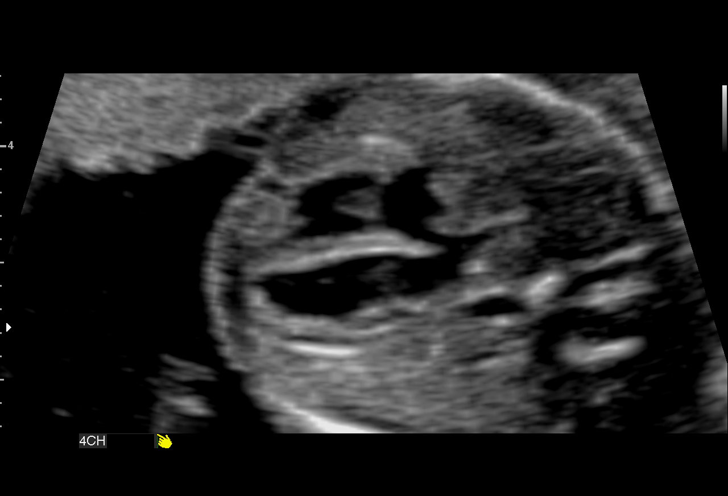
[im 27/81]
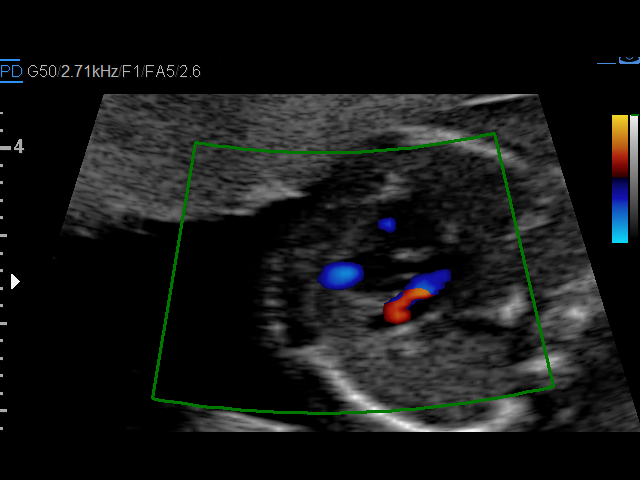
[im 33/81]
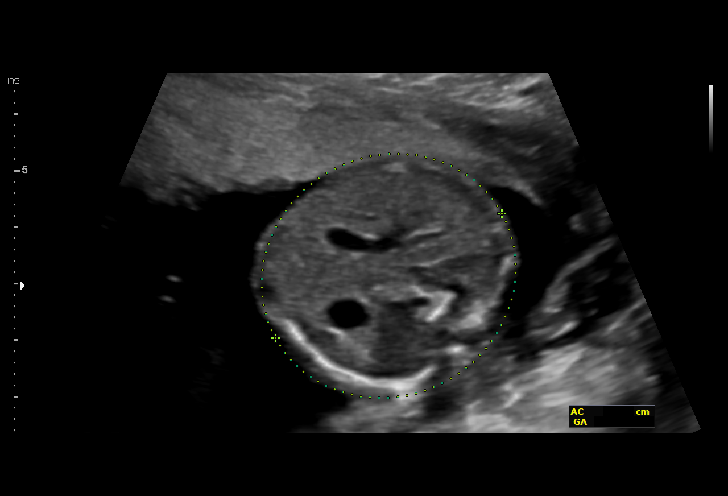
[im 42/81]
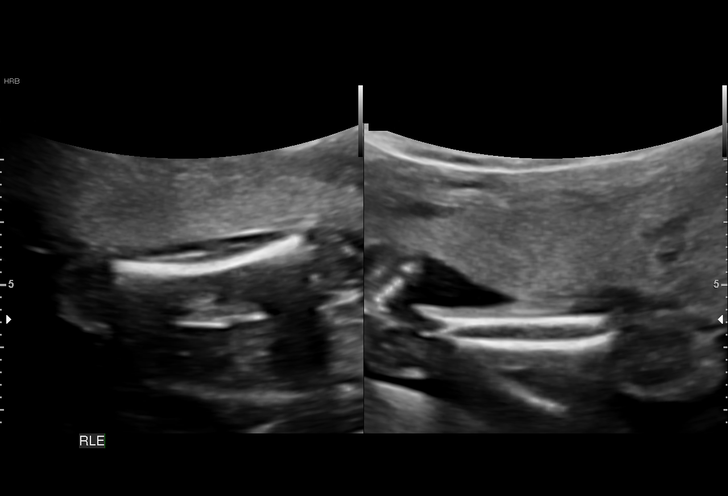
[im 48/81]
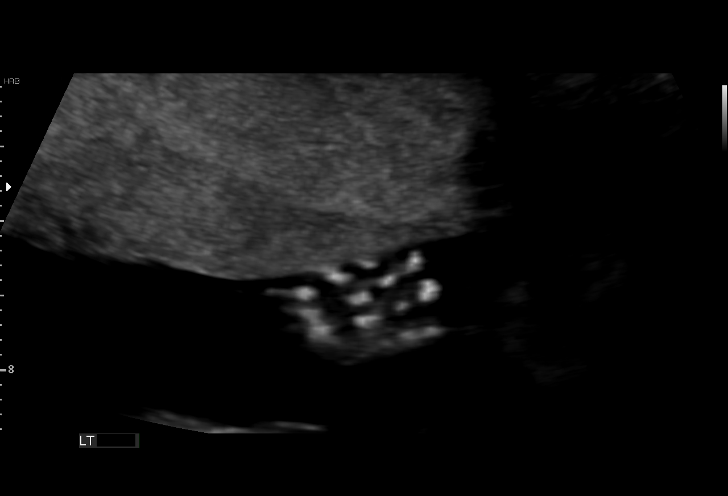
[im 54/81]
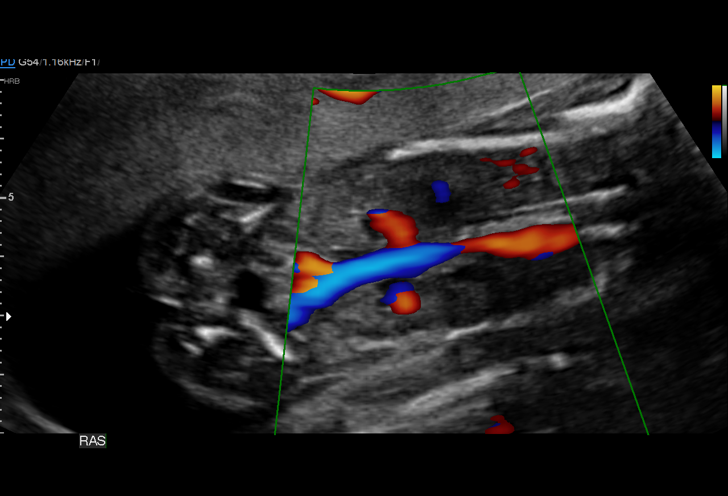
[im 60/81]
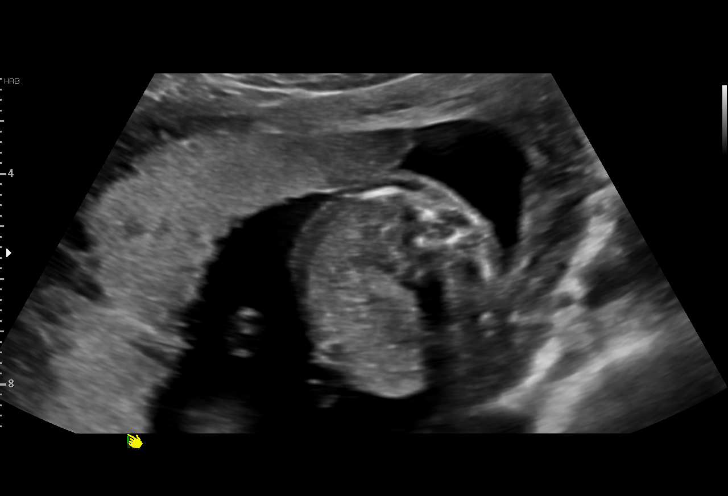
[im 66/81]
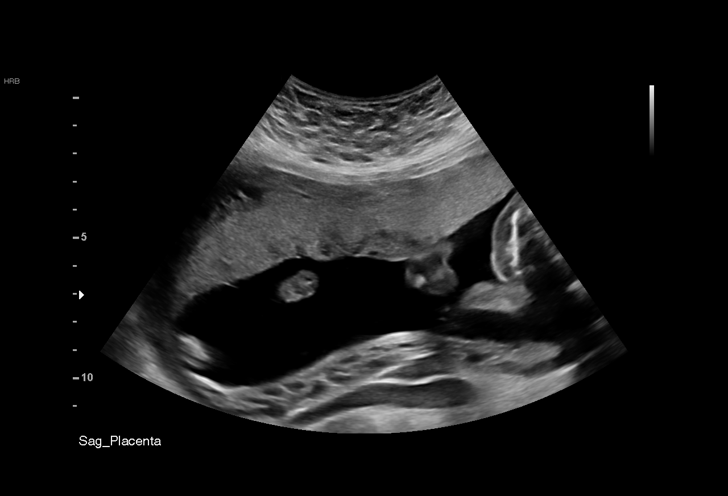
[im 72/81]
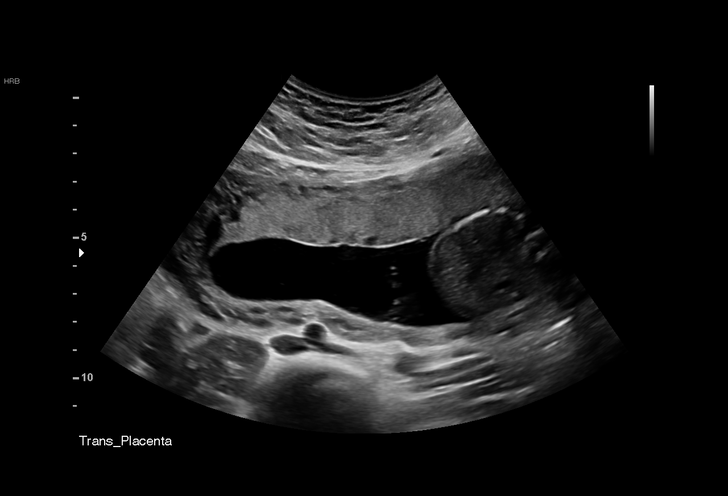
[im 78/81]
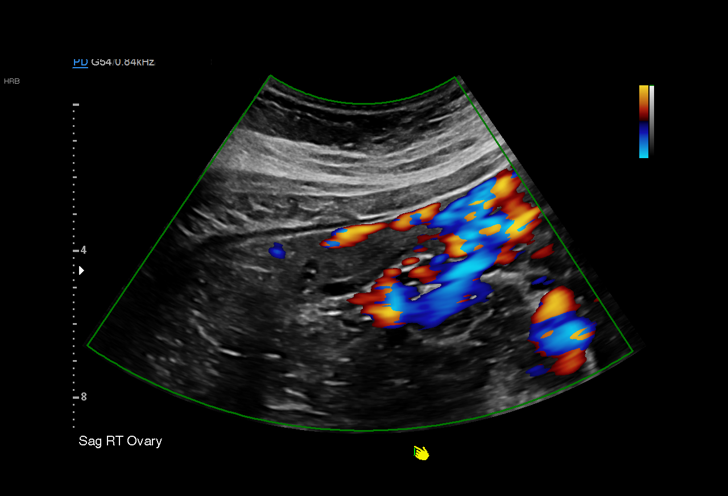

[13 of 28 positions shown; findings below may reference images not displayed]

1  US MFM OB COMP + 14 WK               76805.01     FABRICIO ADAMO
 ----------------------------------------------------------------------

 ----------------------------------------------------------------------
Indications

  Encounter for antenatal screening for
  malformations (low risk NIPS)
  19 weeks gestation of pregnancy
 ----------------------------------------------------------------------
Vital Signs

 BMI:
Fetal Evaluation

 Num Of Fetuses:         1
 Cardiac Activity:       Observed
 Presentation:           Cephalic
 Placenta:               Anterior
 P. Cord Insertion:      Visualized, central

 Amniotic Fluid
 AFI FV:      Within normal limits

                             Largest Pocket(cm)

Biometry

 BPD:      44.8  mm     G. Age:  19w 4d         49  %    CI:        75.58   %    70 - 86
                                                         FL/HC:      17.4   %    16.8 -
 HC:      163.4  mm     G. Age:  19w 1d         22  %    HC/AC:      1.14        1.09 -
 AC:      142.9  mm     G. Age:  19w 4d         47  %    FL/BPD:     63.6   %
 FL:       28.5  mm     G. Age:  18w 5d         17  %    FL/AC:      19.9   %    20 - 24
 HUM:      28.6  mm     G. Age:  19w 2d         44  %
 CER:      20.2  mm     G. Age:  19w 1d         43  %
 NFT:       4.1  mm
 LV:        7.2  mm
 CM:        4.4  mm

 Est. FW:     280  gm    0 lb 10 oz      40  %
OB History

 Gravidity:    2         Term:   0        Prem:   0        SAB:   0
 TOP:          0       Ectopic:  0        Living: 1
Gestational Age

 LMP:           19w 4d        Date:  05/28/18                 EDD:   03/04/19
 U/S Today:     19w 2d                                        EDD:   03/06/19
 Best:          19w 4d     Det. By:  LMP  (05/28/18)          EDD:   03/04/19
Anatomy

 Cranium:               Appears normal         Aortic Arch:            Appears normal
 Cavum:                 Appears normal         Ductal Arch:            Appears normal
 Ventricles:            Appears normal         Diaphragm:              Appears normal
 Choroid Plexus:        Appears normal         Stomach:                Appears normal, left
                                                                       sided
 Cerebellum:            Appears normal         Abdomen:                Appears normal
 Posterior Fossa:       Appears normal         Abdominal Wall:         Appears nml (cord
                                                                       insert, abd wall)
 Nuchal Fold:           Appears normal         Cord Vessels:           Appears normal (3
                                                                       vessel cord)
 Face:                  Appears normal         Kidneys:                Appear normal
                        (orbits and profile)
 Lips:                  Appears normal         Bladder:                Appears normal
 Thoracic:              Appears normal         Spine:                  Appears normal
 Heart:                 Appears normal         Upper Extremities:      Appears normal
                        (4CH, axis, and
                        situs)
 RVOT:                  Appears normal         Lower Extremities:      Appears normal
 LVOT:                  Appears normal

 Other:  Male gender. Heels and 5th digit visualized. Nasal bone visualized.
Cervix Uterus Adnexa

 Cervix
 Length:           3.33  cm.
 Normal appearance by transabdominal scan.

 Uterus
 No abnormality visualized.

 Left Ovary
 Within normal limits.

 Right Ovary
 Within normal limits.

 Cul De Sac
 No free fluid seen.
 Adnexa
 No abnormality visualized.
Impression

 We performed fetal anatomy scan. No makers of
 aneuploidies or fetal structural defects are seen. Fetal
 biometry is consistent with her previously-established dates.
 Amniotic fluid is normal and good fetal activity is seen.

 On cell-free fetal DNA screening, the risks of fetal
 aneuploidies are not increased. MSAFP screening showed
 low risk for open-neural tube defects.
Recommendations

 Follow-up as clinically indicated.
                 Jacoby, Kiyah

## 2020-01-11 ENCOUNTER — Emergency Department (HOSPITAL_COMMUNITY)
Admission: EM | Admit: 2020-01-11 | Discharge: 2020-01-12 | Disposition: A | Discharge status: Home / Self Care | Payer: Medicaid Other | Attending: Emergency Medicine | Admitting: Emergency Medicine

## 2020-01-11 ENCOUNTER — Ambulatory Visit: Payer: Medicaid Other | Attending: Internal Medicine

## 2020-01-11 ENCOUNTER — Encounter (HOSPITAL_COMMUNITY): Payer: Self-pay

## 2020-01-11 ENCOUNTER — Other Ambulatory Visit: Payer: Self-pay

## 2020-01-11 DIAGNOSIS — R42 Dizziness and giddiness: Secondary | ICD-10-CM | POA: Insufficient documentation

## 2020-01-11 DIAGNOSIS — R112 Nausea with vomiting, unspecified: Secondary | ICD-10-CM | POA: Diagnosis not present

## 2020-01-11 DIAGNOSIS — Z23 Encounter for immunization: Secondary | ICD-10-CM

## 2020-01-11 DIAGNOSIS — Z5321 Procedure and treatment not carried out due to patient leaving prior to being seen by health care provider: Secondary | ICD-10-CM | POA: Diagnosis not present

## 2020-01-11 LAB — BASIC METABOLIC PANEL
Anion gap: 12 (ref 5–15)
BUN: 9 mg/dL (ref 6–20)
CO2: 22 mmol/L (ref 22–32)
Calcium: 9.6 mg/dL (ref 8.9–10.3)
Chloride: 105 mmol/L (ref 98–111)
Creatinine, Ser: 1 mg/dL (ref 0.44–1.00)
GFR calc Af Amer: >60 mL/min (ref >60)
GFR calc non Af Amer: >60 mL/min (ref >60)
Glucose, Bld: 108 mg/dL — ABNORMAL HIGH (ref 70–99)
Potassium: 3.7 mmol/L (ref 3.5–5.1)
Sodium: 139 mmol/L (ref 135–145)

## 2020-01-11 LAB — I-STAT BETA HCG BLOOD, ED (MC, WL, AP ONLY): I-stat hCG, quantitative: <5 m[IU]/mL (ref <5)

## 2020-01-11 LAB — CBC
HCT: 42.9 % (ref 36.0–46.0)
Hemoglobin: 13.8 g/dL (ref 12.0–15.0)
MCH: 26.6 pg (ref 26.0–34.0)
MCHC: 32.2 g/dL (ref 30.0–36.0)
MCV: 82.7 fL (ref 80.0–100.0)
Platelets: 360 10*3/uL (ref 150–400)
RBC: 5.19 MIL/uL — ABNORMAL HIGH (ref 3.87–5.11)
RDW: 13.1 % (ref 11.5–15.5)
WBC: 12.6 10*3/uL — ABNORMAL HIGH (ref 4.0–10.5)
nRBC: 0 % (ref 0.0–0.2)

## 2020-01-11 MED ORDER — SODIUM CHLORIDE 0.9% FLUSH: 3.0000 mL | Freq: Once | INTRAVENOUS | Status: DC

## 2020-01-11 MED ORDER — ONDANSETRON 4 MG PO TBDP
4.0000 mg | ORAL_TABLET | Freq: Once | ORAL | Status: AC
  Administered 2020-01-11: 4 mg via ORAL
  Filled 2020-01-11: qty 1

## 2020-01-11 NOTE — ED Triage Notes (Addendum)
Pt arrives to ED w/ sudden onset of dizziness and feeling lightheaded that started approx 1 hour ago. Pt endorses n/v. Pt tachycardic in triage.  Neuro intact, AOx4.

## 2020-01-11 NOTE — Progress Notes (Signed)
   Covid-19 Vaccination Clinic  Name:  [name]    MRN: [id number] DOB: 1995/10/17  01/11/2020  Ms. Stones was observed post Covid-19 immunization for 15 minutes without incident. She was provided with Vaccine Information Sheet and instruction to access the V-Safe system.   [name] was instructed to call 911 with any severe reactions post vaccine: Marland Kitchen Difficulty breathing  . Swelling of face and throat  . A fast heartbeat  . A bad rash all over body  . Dizziness and weakness   Immunizations Administered    Name Date Dose VIS Date Route   Pfizer COVID-19 Vaccine 01/11/2020  8:42 AM 0.3 mL 09/20/2018 Intramuscular   Manufacturer: [place]   Lot: [id number]   [place]: [address]-2

## 2020-03-25 ENCOUNTER — Other Ambulatory Visit: Payer: Self-pay

## 2020-03-25 ENCOUNTER — Ambulatory Visit (LOCAL_COMMUNITY_HEALTH_CENTER): Payer: Self-pay

## 2020-03-25 DIAGNOSIS — Z719 Counseling, unspecified: Secondary | ICD-10-CM

## 2020-03-25 DIAGNOSIS — Z111 Encounter for screening for respiratory tuberculosis: Secondary | ICD-10-CM

## 2020-03-25 NOTE — Progress Notes (Signed)
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 Needs Tdap, but does not have record with her. No NCIR. Pt counseled to schedule PPDR for Thursday 03/28/2020 and an imm. appt at that time. Pt agrees to bring vaccine record to appt.  Questions answered and states understanding. Josie Saunders, RN

## 2020-03-28 ENCOUNTER — Other Ambulatory Visit: Payer: Self-pay

## 2020-03-28 ENCOUNTER — Ambulatory Visit (LOCAL_COMMUNITY_HEALTH_CENTER): Payer: Medicaid Other

## 2020-03-28 ENCOUNTER — Ambulatory Visit (LOCAL_COMMUNITY_HEALTH_CENTER): Payer: Self-pay

## 2020-03-28 DIAGNOSIS — Z23 Encounter for immunization: Secondary | ICD-10-CM | POA: Diagnosis not present

## 2020-03-28 DIAGNOSIS — Z111 Encounter for screening for respiratory tuberculosis: Secondary | ICD-10-CM

## 2020-03-28 LAB — TB SKIN TEST
Induration: 0 mm
TB Skin Test: NEGATIVE

## 2020-03-28 NOTE — Progress Notes (Signed)
PPDR and Tdap today. Aileen Fass, RN

## 2020-04-05 ENCOUNTER — Other Ambulatory Visit: Payer: Self-pay

## 2020-04-05 ENCOUNTER — Ambulatory Visit (LOCAL_COMMUNITY_HEALTH_CENTER): Payer: Medicaid Other

## 2020-04-05 DIAGNOSIS — Z111 Encounter for screening for respiratory tuberculosis: Secondary | ICD-10-CM

## 2020-04-08 ENCOUNTER — Ambulatory Visit (LOCAL_COMMUNITY_HEALTH_CENTER): Payer: Medicaid Other

## 2020-04-08 ENCOUNTER — Other Ambulatory Visit: Payer: Self-pay

## 2020-04-08 DIAGNOSIS — Z111 Encounter for screening for respiratory tuberculosis: Secondary | ICD-10-CM

## 2020-04-08 LAB — TB SKIN TEST
Induration: 0 mm
TB Skin Test: NEGATIVE

## 2020-05-06 ENCOUNTER — Other Ambulatory Visit: Payer: Self-pay

## 2020-05-06 ENCOUNTER — Ambulatory Visit (INDEPENDENT_AMBULATORY_CARE_PROVIDER_SITE_OTHER): Payer: Medicaid Other | Admitting: Family Medicine

## 2020-05-06 ENCOUNTER — Encounter: Payer: Self-pay | Admitting: Family Medicine

## 2020-05-06 VITALS — BP 92/60 | HR 72 | Ht 63.0 in | Wt 181.1 lb

## 2020-05-06 DIAGNOSIS — E669 Obesity, unspecified: Secondary | ICD-10-CM

## 2020-05-06 DIAGNOSIS — Z23 Encounter for immunization: Secondary | ICD-10-CM

## 2020-05-06 NOTE — Patient Instructions (Addendum)
It was nice to meet you Ms Lebron.  Thank you for coming in.  Continue to eat healthy and exercise regularly.  We can discuss strategies for losing weight at next visit, keep up good work for time being.  Please reach out to [place] if you have any return in significant depression or anxiety or thoughts of wanting to hurt yourself       Please follow-up in 1 year or sooner if any issues arise.  I look forward to continuing to take care of you.   Be Well, Dr Delora Fuel

## 2020-05-08 ENCOUNTER — Encounter: Payer: Self-pay | Admitting: Family Medicine

## 2020-05-08 NOTE — Assessment & Plan Note (Signed)
Previously on Zoloft but not taking for several months.  No SI.  Has good support system and feels overall her mood has been good.  PHQ-9 score of 8 with 0 for q9. - Spoke with patient about reaching out to [place] if has worsening Depression/Anxiety, will consider restarting Zoloft or other SSRI and provide resources

## 2020-05-08 NOTE — Progress Notes (Signed)
    SUBJECTIVE:   CHIEF COMPLAINT / HPI: Establish care  Deborah Rice is a 24 yo female looking to establish care.  Her last time seeing a PCP was in 2018 when she was living in [place]. 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  She has no complaints at this time.  Does indicate she wishes to lose weight.    She was previously taking Zoloft for post-partum depression, but has not taken for several months and feels she has been doing fine since stopping.  Indicates much of stress around this time 1 year ago after separation from her partner of 10 years..  Denies any current active or passive SI, and wants to "be there for her kids."  She does not smoke or use other substances.  Has occasional alcoholic drink around once a month.  Indicates she has good support system in Mom and Best friend, both who she talks to regularly on phone.   PERTINENT  PMH / PSH:  Depression/Anxiety, Obesity  OBJECTIVE:   BP 92/60   Pulse 72   Ht 5\' 3"  (1.6 m)   Wt 181 lb 2 oz (82.2 kg)   LMP 04/22/2020   SpO2 98%   BMI 32.08 kg/m    Physical Exam Constitutional:      General: She is not in acute distress.    Appearance: Normal appearance. She is not ill-appearing.  HENT:     Head: Normocephalic.     Mouth/Throat:     Mouth: Mucous membranes are moist.  Eyes:     Pupils: Pupils are equal, round, and reactive to light.  Cardiovascular:     Rate and Rhythm: Normal rate and regular rhythm.  Pulmonary:     Effort: Pulmonary effort is normal.     Breath sounds: Normal breath sounds.  Abdominal:     General: Abdomen is flat.     Palpations: Abdomen is soft.  Skin:    General: Skin is warm.  Neurological:     General: No focal deficit present.     Mental Status: She is alert.     Motor: No weakness.     Gait: Gait normal.     ASSESSMENT/PLAN:   Depression Previously on Zoloft but not taking for several months.  No SI.  Has good support system and feels overall her mood has been good.  PHQ-9 score  of 8 with 0 for q9. - Spoke with patient about reaching out to [place] if has worsening Depression/Anxiety, will consider restarting Zoloft or other SSRI and provide resources  BMI 30s Patient has been losing weight since birth of last child.  Indicates has some success but feels has been limited by stress.  Praised patient for current efforts.  Last A1C in 07/2018 was 4.9.  No previous lipid labs - Obtain Lipid profile and A1C at future visit - Discuss further in future visits regarding healthy diet and exercise if patient having issues with continuing to lose weight   Immunizations Patient received 2nd COVID vaccine on 6//21.  Needs flu shot today, otherwise UTD on immunizations. - Flu shot given today - Consider COVID booster in 12/21 as patient will be working in [place], [place]

## 2020-05-08 NOTE — Assessment & Plan Note (Signed)
Patient has been losing weight since birth of last child.  Indicates has some success but feels has been limited by stress.  Praised patient for current efforts.  Last A1C in 07/2018 was 4.9.  No previous lipid labs - Obtain Lipid profile and A1C at future visit - Discuss further in future visits regarding healthy diet and exercise if patient having issues with continuing to lose weight

## 2020-05-22 ENCOUNTER — Encounter: Payer: Self-pay | Admitting: Advanced Practice Midwife

## 2020-05-22 ENCOUNTER — Ambulatory Visit (INDEPENDENT_AMBULATORY_CARE_PROVIDER_SITE_OTHER): Payer: Medicaid Other | Admitting: Advanced Practice Midwife

## 2020-05-22 ENCOUNTER — Other Ambulatory Visit: Payer: Self-pay

## 2020-05-22 VITALS — BP 107/72 | HR 72 | Ht 64.0 in | Wt 179.0 lb

## 2020-05-22 DIAGNOSIS — Z975 Presence of (intrauterine) contraceptive device: Secondary | ICD-10-CM

## 2020-05-22 DIAGNOSIS — Z0000 Encounter for general adult medical examination without abnormal findings: Secondary | ICD-10-CM

## 2020-05-22 NOTE — Patient Instructions (Signed)
Preventive Care 21-25 Years Old, Female Preventive care refers to visits with your health care provider and lifestyle choices that can promote health and wellness. This includes:  A yearly physical exam. This may also be called an annual well check.  Regular dental visits and eye exams.  Immunizations.  Screening for certain conditions.  Healthy lifestyle choices, such as eating a healthy diet, getting regular exercise, not using drugs or products that contain nicotine and tobacco, and limiting alcohol use. What can I expect for my preventive care visit? Physical exam Your health care provider will check your:  Height and weight. This may be used to calculate body mass index (BMI), which tells if you are at a healthy weight.  Heart rate and blood pressure.  Skin for abnormal spots. Counseling Your health care provider may ask you questions about your:  Alcohol, tobacco, and drug use.  Emotional well-being.  Home and relationship well-being.  Sexual activity.  Eating habits.  Work and work environment.  Method of birth control.  Menstrual cycle.  Pregnancy history. What immunizations do I need?  Influenza (flu) vaccine  This is recommended every year. Tetanus, diphtheria, and pertussis (Tdap) vaccine  You may need a Td booster every 10 years. Varicella (chickenpox) vaccine  You may need this if you have not been vaccinated. Human papillomavirus (HPV) vaccine  If recommended by your health care provider, you may need three doses over 6 months. Measles, mumps, and rubella (MMR) vaccine  You may need at least one dose of MMR. You may also need a second dose. Meningococcal conjugate (MenACWY) vaccine  One dose is recommended if you are age 19-21 years and a first-year college student living in a residence hall, or if you have one of several medical conditions. You may also need additional booster doses. Pneumococcal conjugate (PCV13) vaccine  You may need  this if you have certain conditions and were not previously vaccinated. Pneumococcal polysaccharide (PPSV23) vaccine  You may need one or two doses if you smoke cigarettes or if you have certain conditions. Hepatitis A vaccine  You may need this if you have certain conditions or if you travel or work in places where you may be exposed to hepatitis A. Hepatitis B vaccine  You may need this if you have certain conditions or if you travel or work in places where you may be exposed to hepatitis B. Haemophilus influenzae type b (Hib) vaccine  You may need this if you have certain conditions. You may receive vaccines as individual doses or as more than one vaccine together in one shot (combination vaccines). Talk with your health care provider about the risks and benefits of combination vaccines. What tests do I need?  Blood tests  Lipid and cholesterol levels. These may be checked every 5 years starting at age 20.  Hepatitis C test.  Hepatitis B test. Screening  Diabetes screening. This is done by checking your blood sugar (glucose) after you have not eaten for a while (fasting).  Sexually transmitted disease (STD) testing.  BRCA-related cancer screening. This may be done if you have a family history of breast, ovarian, tubal, or peritoneal cancers.  Pelvic exam and Pap test. This may be done every 3 years starting at age 21. Starting at age 30, this may be done every 5 years if you have a Pap test in combination with an HPV test. Talk with your health care provider about your test results, treatment options, and if necessary, the need for more tests.   Follow these instructions at home: Eating and drinking   Eat a diet that includes fresh fruits and vegetables, whole grains, lean protein, and low-fat dairy.  Take vitamin and mineral supplements as recommended by your health care provider.  Do not drink alcohol if: ? Your health care provider tells you not to drink. ? You are  pregnant, may be pregnant, or are planning to become pregnant.  If you drink alcohol: ? Limit how much you have to 0-1 drink a day. ? Be aware of how much alcohol is in your drink. In the [place] one drink equals one 12 oz bottle of beer (355 mL), one 5 oz glass of wine (148 mL), or one 1 oz glass of hard liquor (44 mL). Lifestyle  Take daily care of your teeth and gums.  Stay active. Exercise for at least 30 minutes on 5 or more days each week.  Do not use any products that contain nicotine or tobacco, such as cigarettes, e-cigarettes, and chewing tobacco. If you need help quitting, ask your health care provider.  If you are sexually active, practice safe sex. Use a condom or other form of birth control (contraception) in order to prevent pregnancy and STIs (sexually transmitted infections). If you plan to become pregnant, see your health care provider for a preconception visit. What's next?  Visit your health care provider once a year for a well check visit.  Ask your health care provider how often you should have your eyes and teeth checked.  Stay up to date on all vaccines. This information is not intended to replace advice given to you by your health care provider. Make sure you discuss any questions you have with your health care provider. Document Revised: 03/24/2018 Document Reviewed: 03/24/2018 Elsevier Patient Education  2020 [employer].

## 2020-05-22 NOTE — Progress Notes (Signed)
GYNECOLOGY ANNUAL PREVENTATIVE CARE ENCOUNTER NOTE  History:     Deborah Rice is a 24 y.o. [id number] female here for a routine annual gynecologic exam.  Current complaints: none.   Denies abnormal vaginal bleeding, discharge, pelvic pain, problems with intercourse or other gynecologic concerns.    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 Non-smoker   Gynecologic History Patient's last menstrual period was 04/22/2020. Contraception: Nexplanon Last Pap: 03/2019. Results were: normal with negative HPV   Obstetric History OB History  Gravida Para Term Preterm AB Living  2 2 2     2   SAB TAB Ectopic Multiple Live Births        0 2    # Outcome Date GA Lbr Len/2nd Weight Sex Delivery Anes PTL Lv  2 Term 03/01/19 [redacted]w[redacted]d 05:37 / 00:03 7 lb 13.8 oz (3.565 kg) M Vag-Spont None  LIV  1 Term 07/28/15   7 lb (3.175 kg) M Vag-Spont  N LIV    Past Medical History:  Diagnosis Date  . Asthma   . Low TSH level 09/10/2018   Normal 03/29/2019   Neg rpt at 28wks Normal fT4    Past Surgical History:  Procedure Laterality Date  . DILATION AND EVACUATION N/A 03/11/2019   Procedure: DILATATION AND EVACUATION;  Surgeon: Mora Bellman, MD;  Location: [facility];  Service: Gynecology;  Laterality: N/A;  . WISDOM TOOTH EXTRACTION  2014    Current Outpatient Medications on File Prior to Visit  Medication Sig Dispense Refill  . etonogestrel (NEXPLANON) 68 MG IMPL implant 1 each by Subdermal route once.     No current facility-administered medications on file prior to visit.    No Known Allergies  Social History:  reports that she has never smoked. She has never used smokeless tobacco. She reports current alcohol use of about 1.0 standard drink of alcohol per week. She reports previous drug use.  Family History  Problem Relation Age of Onset  . Asthma Sister   . Asthma Brother   . Diabetes Maternal Grandmother      The following portions of the patient's history were reviewed and updated as appropriate: allergies, current medications, past family history, past medical history, past social history, past surgical history and problem list.  Review of Systems Pertinent items noted in HPI and remainder of comprehensive ROS otherwise negative.  Physical Exam:  BP 107/72   Pulse 72   Ht 5\' 4"  (1.626 m)   Wt 179 lb (81.2 kg)   LMP 04/22/2020   BMI 30.73 kg/m  CONSTITUTIONAL: Well-developed, well-nourished female in no acute distress.  HENT:  Normocephalic, atraumatic, External right and left ear normal. Oropharynx is clear and moist EYES: Conjunctivae and EOM are normal. Pupils are equal, round, and reactive to light. No scleral icterus.  NECK: Normal range of motion, supple, no masses.  Normal thyroid.  SKIN: Skin is warm and dry. No rash noted. Not diaphoretic. No erythema. No pallor. MUSCULOSKELETAL: Normal range of motion. No tenderness.  No cyanosis, clubbing, or edema.  2+ distal pulses. NEUROLOGIC: Alert and oriented to person, place, and time. Normal reflexes, muscle tone coordination.  PSYCHIATRIC: Normal mood and affect. Normal behavior. Normal judgment and thought content. CARDIOVASCULAR: Normal heart rate noted, regular rhythm RESPIRATORY: Clear to auscultation bilaterally. Effort and breath sounds normal, no problems with respiration noted. BREASTS: Symmetric in size. No masses, tenderness, skin changes, nipple drainage, or lymphadenopathy bilaterally. Performed in  the presence of a chaperone. ABDOMEN: Soft, no distention noted.  No tenderness, rebound or guarding.  PELVIC: Deferred   Assessment and Plan:    1. Well woman exam (no gynecological exam) - No concerning findings on physical exam - CBC - TSH - Hemoglobin A1c - Comprehensive metabolic panel - Lipid panel  2. Nexplanon in place - Left arm   Routine preventative health maintenance measures emphasized. Please refer to  After Visit Summary for other counseling recommendations.      Mallie Snooks, MSN, CNM Certified Nurse Midwife, [facility] for [employer], [facility] 05/22/20 12:07 PM

## 2020-05-23 LAB — LIPID PANEL
Chol/HDL Ratio: 5.1 ratio — ABNORMAL HIGH (ref 0.0–4.4)
Cholesterol, Total: 163 mg/dL (ref 100–199)
HDL: 32 mg/dL — ABNORMAL LOW (ref >39)
LDL Chol Calc (NIH): 116 mg/dL — ABNORMAL HIGH (ref 0–99)
Triglycerides: 79 mg/dL (ref 0–149)
VLDL Cholesterol Cal: 15 mg/dL (ref 5–40)

## 2020-05-23 LAB — CBC
Hematocrit: 41.3 % (ref 34.0–46.6)
Hemoglobin: 13.6 g/dL (ref 11.1–15.9)
MCH: 27.5 pg (ref 26.6–33.0)
MCHC: 32.9 g/dL (ref 31.5–35.7)
MCV: 84 fL (ref 79–97)
Platelets: 288 10*3/uL (ref 150–450)
RBC: 4.94 x10E6/uL (ref 3.77–5.28)
RDW: 12.9 % (ref 11.7–15.4)
WBC: 10.2 10*3/uL (ref 3.4–10.8)

## 2020-05-23 LAB — COMPREHENSIVE METABOLIC PANEL
ALT: 10 IU/L (ref 0–32)
AST: 11 IU/L (ref 0–40)
Albumin/Globulin Ratio: 1.8 (ref 1.2–2.2)
Albumin: 4.8 g/dL (ref 3.9–5.0)
Alkaline Phosphatase: 70 IU/L (ref 44–121)
BUN/Creatinine Ratio: 10 (ref 9–23)
BUN: 10 mg/dL (ref 6–20)
Bilirubin Total: 1.3 mg/dL — ABNORMAL HIGH (ref 0.0–1.2)
CO2: 25 mmol/L (ref 20–29)
Calcium: 9.8 mg/dL (ref 8.7–10.2)
Chloride: 102 mmol/L (ref 96–106)
Creatinine, Ser: 1.04 mg/dL — ABNORMAL HIGH (ref 0.57–1.00)
GFR calc Af Amer: 86 mL/min/{1.73_m2} (ref >59)
GFR calc non Af Amer: 75 mL/min/{1.73_m2} (ref >59)
Globulin, Total: 2.7 g/dL (ref 1.5–4.5)
Glucose: 90 mg/dL (ref 65–99)
Potassium: 3.6 mmol/L (ref 3.5–5.2)
Sodium: 142 mmol/L (ref 134–144)
Total Protein: 7.5 g/dL (ref 6.0–8.5)

## 2020-05-23 LAB — HEMOGLOBIN A1C
Est. average glucose Bld gHb Est-mCnc: 103 mg/dL
Hgb A1c MFr Bld: 5.2 % (ref 4.8–5.6)

## 2020-05-23 LAB — TSH: TSH: 1.26 u[IU]/mL (ref 0.450–4.500)

## 2020-05-28 IMAGING — US US PELVIS COMPLETE
1 series · 15 of 25 positions shown · non-contrast
Comparison: None.

CLINICAL DATA: Postpartum vaginal bleeding

EXAM:
TRANSABDOMINAL ULTRASOUND OF PELVIS
DOPPLER ULTRASOUND OF OVARIES
TECHNIQUE: Transabdominal ultrasound examination of the pelvis was performed
including evaluation of the uterus, ovaries, adnexal regions, and
pelvic cul-de-sac.
Color and duplex Doppler ultrasound was utilized to evaluate blood
flow to the ovaries.

[Series 1: us pelvis complete · 15 of 55 slices shown]
[im 1/55]
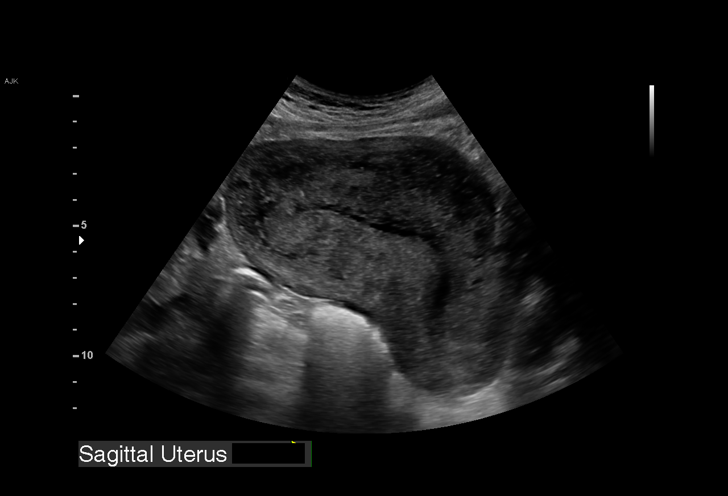
[im 5/55]
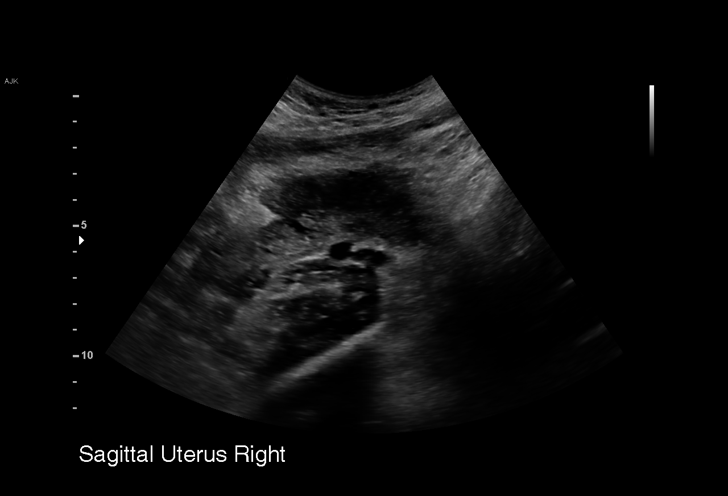
[im 10/55]
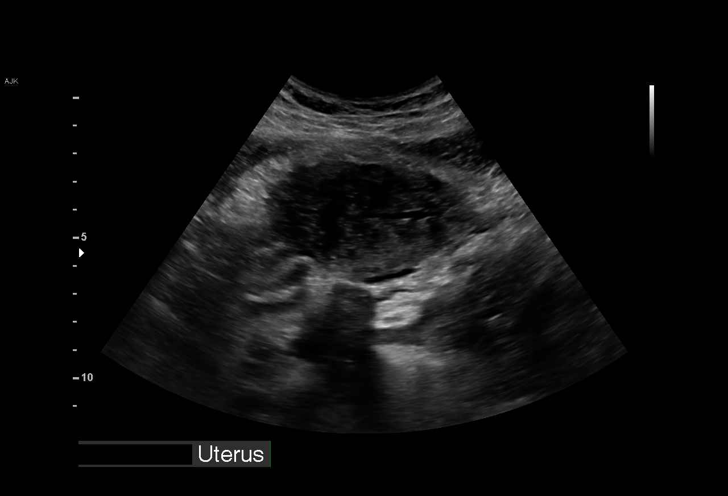
[im 12/55]
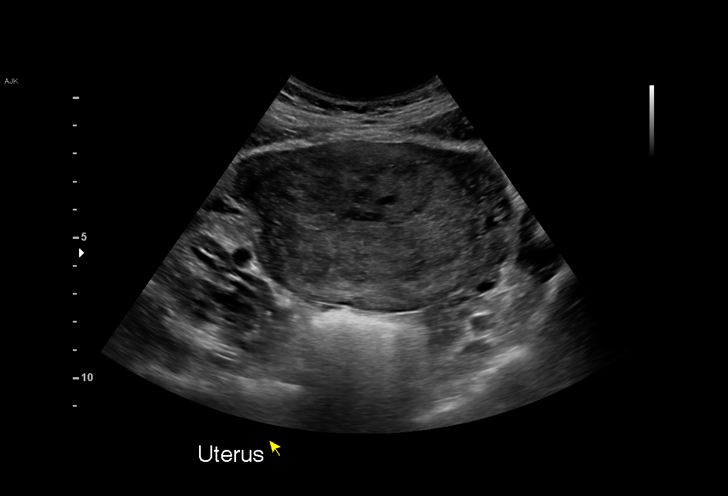
[im 16/55]
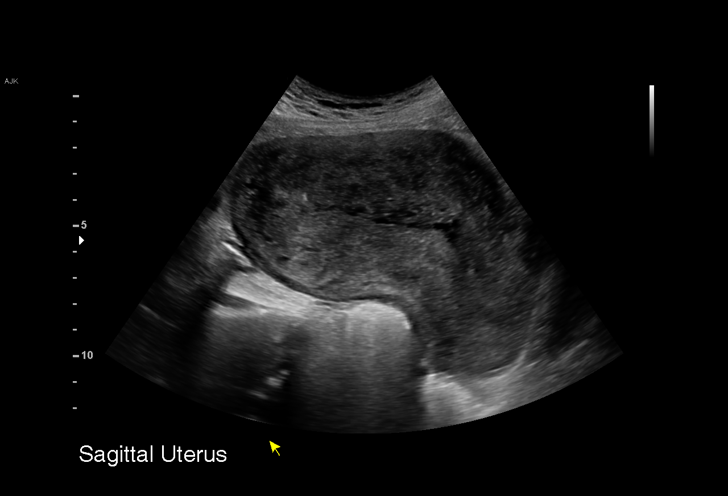
[im 21/55]
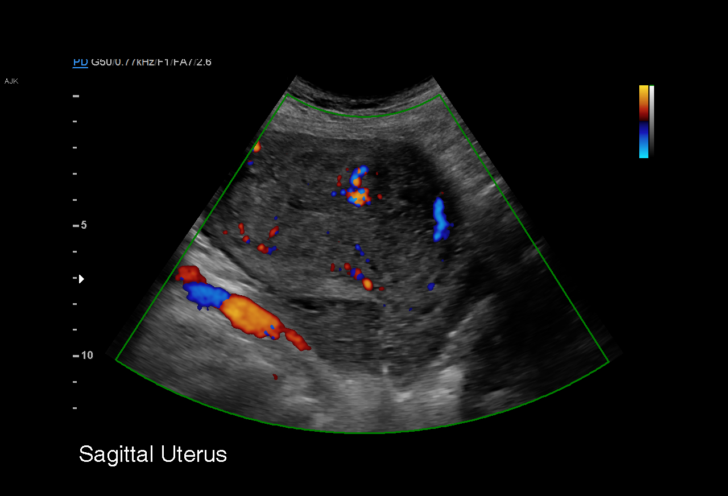
[im 23/55]
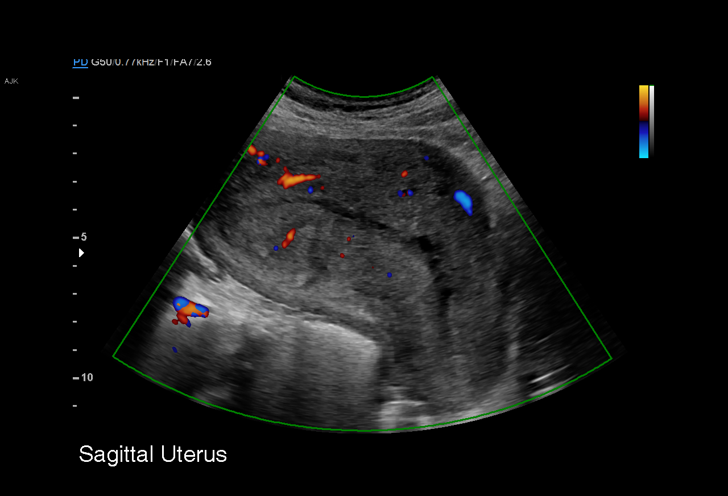
[im 28/55]
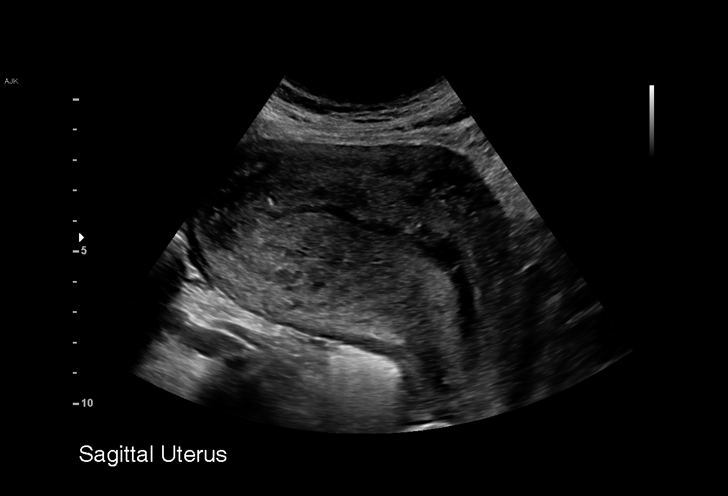
[im 32/55]
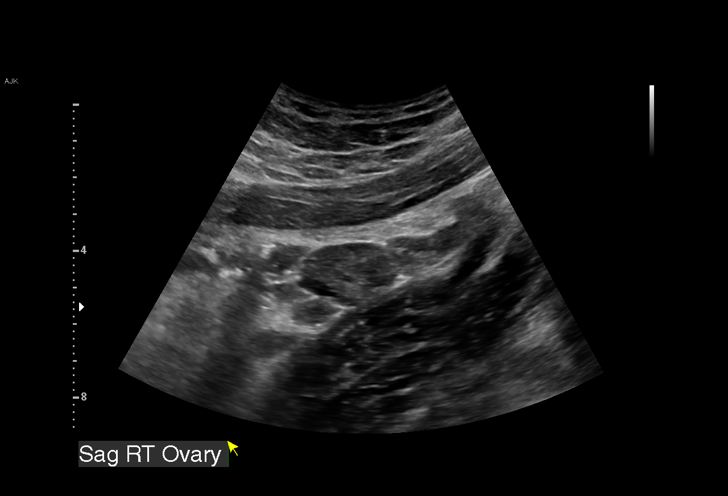
[im 34/55]
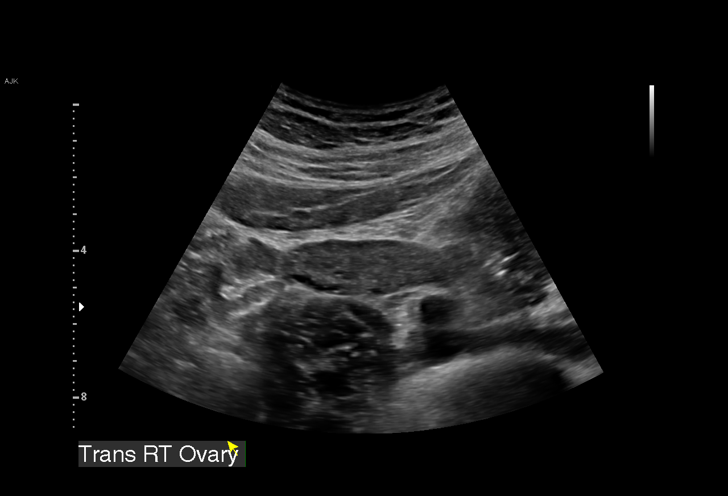
[im 39/55]
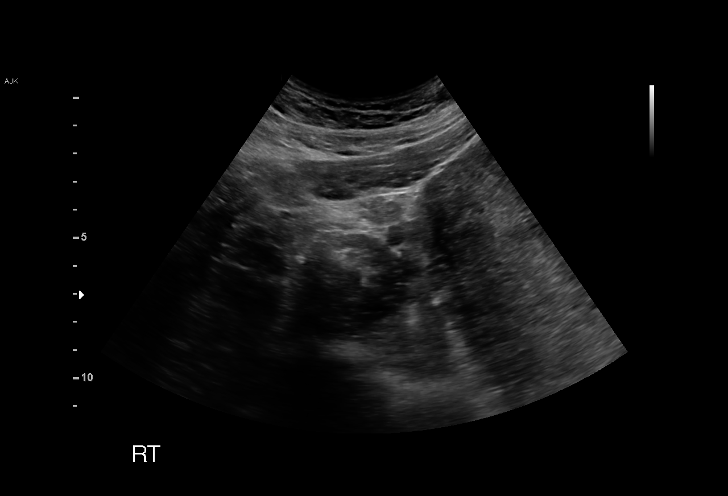
[im 43/55]
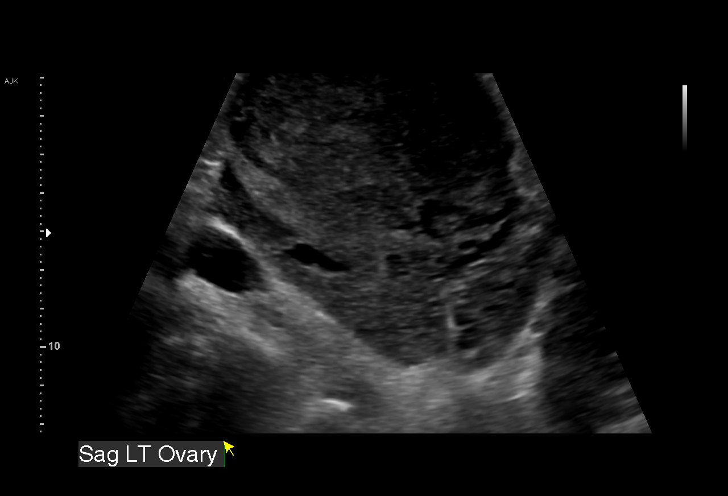
[im 46/55]
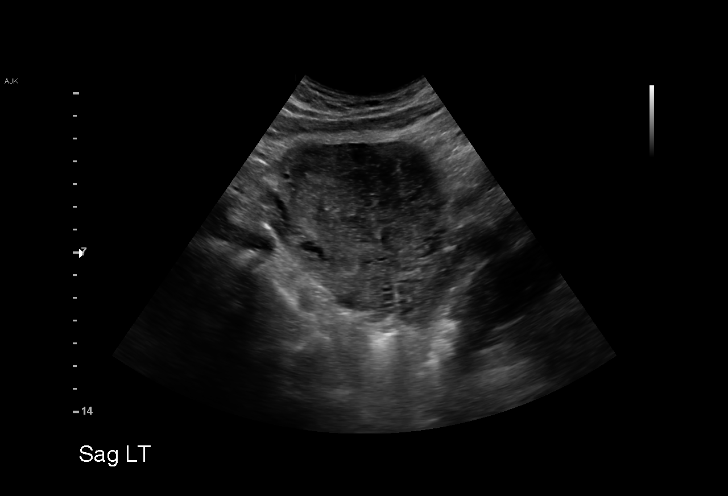
[im 50/55]
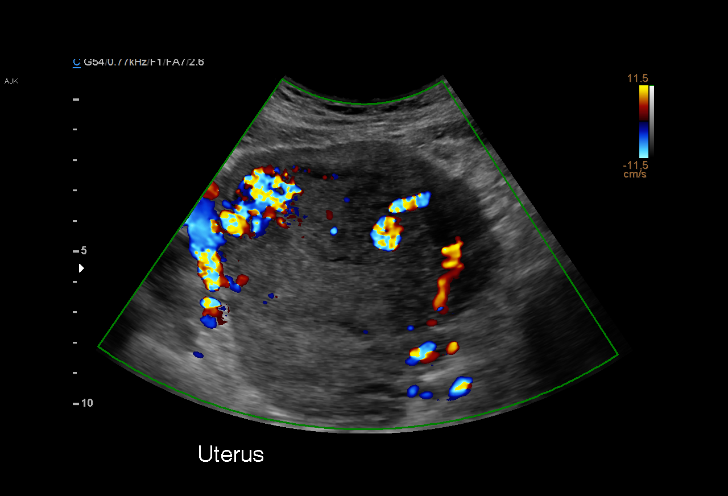
[im 55/55]
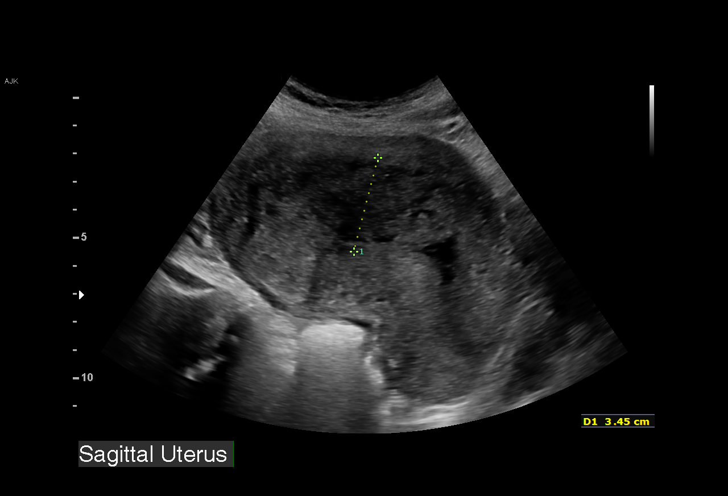

[15 of 25 positions shown; findings below may reference images not displayed]

FINDINGS: Uterus

Measurements: 13.1 x 8.1 x9.1 cm = volume: 503 mL. The uterus
appears heterogeneous with increased vascularity seen within the
myometrium.

Endometrium

Thickness: Diffusely heterogeneous and thickened possibly measuring
3 cm. There does appear to be vascular flow seen within the
endometrium.

Right ovary

Measurements: 2.8 x 1.9 x 5.1 cm = volume: 14.3 mL. Normal
appearance/no adnexal mass.

Left ovary

Measurements: 5.1 x 2.5 x 2.4 cm = volume: 16.0 mL. Normal
appearance/no adnexal mass.

Pulsed Doppler evaluation demonstrates normal low-resistance
arterial and venous waveforms in both ovaries.

Other: No free fluid.
IMPRESSION: Findings which could be suggestive of retained products of
conception in the appropriate clinical setting, with a thickened
vascular heterogeneous endometrium measuring 3.4 cm.

## 2020-11-21 ENCOUNTER — Encounter: Payer: Self-pay | Admitting: Family Medicine

## 2020-11-21 ENCOUNTER — Other Ambulatory Visit (HOSPITAL_COMMUNITY)
Admission: RE | Admit: 2020-11-21 | Discharge: 2020-11-21 | Disposition: A | Discharge status: Home / Self Care | Payer: Medicaid Other | Source: Ambulatory Visit | Attending: Family Medicine | Admitting: Family Medicine

## 2020-11-21 ENCOUNTER — Ambulatory Visit (INDEPENDENT_AMBULATORY_CARE_PROVIDER_SITE_OTHER): Payer: Medicaid Other | Admitting: Family Medicine

## 2020-11-21 ENCOUNTER — Other Ambulatory Visit: Payer: Self-pay

## 2020-11-21 VITALS — BP 126/80 | HR 69 | Wt 196.0 lb

## 2020-11-21 DIAGNOSIS — N912 Amenorrhea, unspecified: Secondary | ICD-10-CM | POA: Diagnosis not present

## 2020-11-21 DIAGNOSIS — N898 Other specified noninflammatory disorders of vagina: Secondary | ICD-10-CM

## 2020-11-21 LAB — POCT URINE PREGNANCY: Preg Test, Ur: NEGATIVE

## 2020-11-21 MED ORDER — FLUCONAZOLE 150 MG PO TABS: 150.0000 mg | ORAL_TABLET | Freq: Once | ORAL | 2 refills | Status: AC

## 2020-11-21 NOTE — Progress Notes (Signed)
Recurrent yeast infection, three this past month  Also having irregular cycles

## 2020-11-21 NOTE — Progress Notes (Signed)
   Subjective:    Patient ID: Deborah Rice is a 25 y.o. female presenting with Vaginitis and Menstrual Problem  on 11/21/2020  HPI: Treating some yeast infection at home. Has had this in the past with discharge like cottage cheese. Notes itching and burning.  Tried Monistat x 2, and then it came back again. Has had 3 infections in last 1.5 months.  Review of Systems  Constitutional: Negative for chills and fever.  Respiratory: Negative for shortness of breath.   Cardiovascular: Negative for chest pain.  Gastrointestinal: Negative for abdominal pain, nausea and vomiting.  Genitourinary: Negative for dysuria.  Skin: Negative for rash.      Objective:    BP 126/80   Pulse 69   Wt 196 lb (88.9 kg)   BMI 33.64 kg/m  Physical Exam Constitutional:      General: She is not in acute distress.    Appearance: She is well-developed.  HENT:     Head: Normocephalic and atraumatic.  Eyes:     General: No scleral icterus. Cardiovascular:     Rate and Rhythm: Normal rate.  Pulmonary:     Effort: Pulmonary effort is normal.  Abdominal:     Palpations: Abdomen is soft.  Genitourinary:    Comments: BUS normal, vagina is pink and rugated, adherent clumpy white discharge noted. cervix is parous without lesion  Musculoskeletal:     Cervical back: Neck supple.  Skin:    General: Skin is warm and dry.  Neurological:     Mental Status: She is alert and oriented to person, place, and time.         Assessment & Plan:  Amenorrhea - Negative UPT - Plan: POCT urine pregnancy  Vaginal discharge - worsening, check cultures, treat presumptively, trial of boric acid suppositories, use 2x/wk or following bath, menses, intercourse. - Plan: Cervicovaginal ancillary only( [place])   Return if symptoms worsen or fail to improve.  Donnamae Jude 11/21/2020 9:58 AM

## 2020-11-22 LAB — CERVICOVAGINAL ANCILLARY ONLY
Bacterial Vaginitis (gardnerella): NEGATIVE
Candida Glabrata: NEGATIVE
Candida Vaginitis: POSITIVE — AB
Chlamydia: NEGATIVE
Comment: NEGATIVE
Comment: NEGATIVE
Comment: NEGATIVE
Comment: NEGATIVE
Comment: NEGATIVE
Comment: NORMAL
Neisseria Gonorrhea: NEGATIVE
Trichomonas: NEGATIVE

## 2020-11-27 ENCOUNTER — Ambulatory Visit (INDEPENDENT_AMBULATORY_CARE_PROVIDER_SITE_OTHER): Payer: Medicaid Other | Admitting: Student in an Organized Health Care Education/Training Program

## 2020-11-27 ENCOUNTER — Other Ambulatory Visit: Payer: Self-pay

## 2020-11-27 DIAGNOSIS — J029 Acute pharyngitis, unspecified: Secondary | ICD-10-CM

## 2020-11-27 NOTE — Patient Instructions (Signed)
It was a pleasure to see you today!  To summarize our discussion for this visit:  I'm sorry to hear you aren't feeling well. It sounds like allergies.   I would recommend an antihistamine such as zyrtec or allegra or the generics are just as good too.  Additionally, a daily nasal steroid such as flonase or nasocort for at least a weak can help a lot.   Please let us know if you are not seeing improvement with these treatments  Some additional health maintenance measures we should update are: Health Maintenance Due  Topic Date Due  . Hepatitis C Screening  Never done  . HPV VACCINES (1 - 2-dose series) Never done  . COVID-19 Vaccine (3 - Booster for Pfizer series) 07/12/2020  .    Call the clinic at [phone] if your symptoms worsen or you have any concerns.   Thank you for allowing me to take part in your care,  Dr. Doristine Mango

## 2020-11-27 NOTE — Progress Notes (Signed)
   SUBJECTIVE:   CHIEF COMPLAINT / HPI: throat irritation  Irritated throat since Saturday (4/30). Associated with dry cough which irritates thr throat worse. Able to eat and drink OK.  No known allergies or similar occurrence in the past.  No fevers. Runny and itchy nose occasionally. Watery eyes possibly itchy. No sick contacts. covid vaccinated.   OBJECTIVE:   BP 100/75   Pulse 77   SpO2 99%   Physical Exam Vitals and nursing note reviewed.  Constitutional:      General: She is not in acute distress.    Appearance: Normal appearance. She is not ill-appearing or toxic-appearing.  HENT:     Head: Normocephalic.     Right Ear: Tympanic membrane, ear canal and external ear normal.     Left Ear: Tympanic membrane, ear canal and external ear normal.     Ears:     Comments: Bilateral mild effusion    Nose: Congestion present. No rhinorrhea.     Mouth/Throat:     Mouth: Mucous membranes are moist.     Pharynx: Oropharynx is clear. No oropharyngeal exudate or posterior oropharyngeal erythema.  Pulmonary:     Effort: Pulmonary effort is normal.  Musculoskeletal:     Cervical back: No rigidity or tenderness.  Lymphadenopathy:     Cervical: No cervical adenopathy.  Skin:    General: Skin is warm and dry.     Capillary Refill: Capillary refill takes less than 2 seconds.     Findings: No rash.  Neurological:     Mental Status: She is alert.  Psychiatric:        Mood and Affect: Mood normal.        Behavior: Behavior normal.        Thought Content: Thought content normal.    ASSESSMENT/PLAN:   Allergic pharyngitis History and physical are more consistent with allergic cause rather than infectious.  Seasonal allergens have also been high in recent weeks. Patient denies history of seasonal allergies.  Recommended OTC histamine blocker as well as nasal steroid spray for 1 week+ and return if not improving.     [place]

## 2020-11-29 DIAGNOSIS — J029 Acute pharyngitis, unspecified: Secondary | ICD-10-CM | POA: Insufficient documentation

## 2020-11-29 NOTE — Assessment & Plan Note (Signed)
History and physical are more consistent with allergic cause rather than infectious.  Seasonal allergens have also been high in recent weeks. Patient denies history of seasonal allergies.  Recommended OTC histamine blocker as well as nasal steroid spray for 1 week+ and return if not improving.

## 2021-03-27 NOTE — Progress Notes (Signed)
hiv   SUBJECTIVE:   Chief compliant/HPI: annual examination  Deborah Rice is a 25 y.o. who presents today for an annual exam. No concerns.   Desires STD testing  -Reports new sexual partner -Nexplanon for birth control (periods are irregular) -Pap is UTD; normal 2020 -No abnormal vaginal odor or discharge  Updated history tabs and problem list.   OBJECTIVE:   BP 110/72   Pulse 63   Ht '5\' 4"'$  (1.626 m)   Wt 206 lb 6 oz (93.6 kg)   SpO2 100%   BMI 35.42 kg/m   Physical Exam Vitals reviewed.  Constitutional:      General: She is not in acute distress. Neck:     Thyroid: No thyroid mass or thyromegaly.  Cardiovascular:     Rate and Rhythm: Normal rate and regular rhythm.  Pulmonary:     Effort: Pulmonary effort is normal.     Breath sounds: Normal breath sounds.  Abdominal:     General: Abdomen is flat.     Palpations: Abdomen is soft. There is no mass.     Tenderness: no abdominal tenderness There is no guarding.  Musculoskeletal:     Cervical back: Neck supple. No tenderness.  Lymphadenopathy:     Cervical: No cervical adenopathy.  Neurological:     Mental Status: She is alert.  Pelvic exam: VULVA: normal appearing vulva with no masses, tenderness or lesions, VAGINA: vaginal discharge - white, copious, thick, and yellow, CERVIX: cervical discharge present - white, copious, thick, and yellow, WET MOUNT done - results: pending, exam chaperoned by Cherrie Distance, CMA.  ASSESSMENT/PLAN:   Annual Examination  See AVS for age appropriate recommendations.   PHQ score 8, reviewed and discussed. Blood pressure reviewed and at goal.  Asked about intimate partner violence and patient reports she feels safe.  The patient currently uses nexplanon for contraception.   Considered the following items based upon USPSTF recommendations: HIV testing: ordered Hepatitis C: ordered Syphilis if at high risk: ordered GC/CT  ordered Reviewed risk factors for latent tuberculosis and not  indicated Cervical cancer screening: prior Pap reviewed, repeat due in 2023 Immunizations- declined COVID booster, states she has received HPV as a child. Needs flu vaccine when available.    Follow up in 1 year or sooner if indicated.    Gerlene Fee, [place]

## 2021-03-28 ENCOUNTER — Ambulatory Visit: Payer: Medicaid Other | Admitting: Family Medicine

## 2021-03-28 ENCOUNTER — Other Ambulatory Visit: Payer: Self-pay

## 2021-03-28 ENCOUNTER — Telehealth: Payer: Self-pay | Admitting: Family Medicine

## 2021-03-28 ENCOUNTER — Encounter: Payer: Self-pay | Admitting: Family Medicine

## 2021-03-28 ENCOUNTER — Other Ambulatory Visit (HOSPITAL_COMMUNITY)
Admission: RE | Admit: 2021-03-28 | Discharge: 2021-03-28 | Disposition: A | Discharge status: Home / Self Care | Payer: Medicaid Other | Source: Ambulatory Visit | Attending: Family Medicine | Admitting: Family Medicine

## 2021-03-28 VITALS — BP 110/72 | HR 63 | Ht 64.0 in | Wt 206.4 lb

## 2021-03-28 DIAGNOSIS — Z1159 Encounter for screening for other viral diseases: Secondary | ICD-10-CM | POA: Diagnosis not present

## 2021-03-28 DIAGNOSIS — Z113 Encounter for screening for infections with a predominantly sexual mode of transmission: Secondary | ICD-10-CM

## 2021-03-28 DIAGNOSIS — Z0000 Encounter for general adult medical examination without abnormal findings: Secondary | ICD-10-CM

## 2021-03-28 DIAGNOSIS — B9689 Other specified bacterial agents as the cause of diseases classified elsewhere: Secondary | ICD-10-CM

## 2021-03-28 LAB — POCT WET PREP (WET MOUNT)
Clue Cells Wet Prep Whiff POC: POSITIVE
Trichomonas Wet Prep HPF POC: ABSENT
WBC, Wet Prep HPF POC: >20

## 2021-03-28 MED ORDER — METRONIDAZOLE 0.75 % VA GEL: 1.0000 | Freq: Every day | VAGINAL | 0 refills | Status: AC

## 2021-03-28 NOTE — Patient Instructions (Signed)
Preventive Care 21-26 Years Old, Female Preventive care refers to lifestyle choices and visits with your health care provider that can promote health and wellness. This includes: A yearly physical exam. This is also called an annual wellness visit. Regular dental and eye exams. Immunizations. Screening for certain conditions. Healthy lifestyle choices, such as: Eating a healthy diet. Getting regular exercise. Not using drugs or products that contain nicotine and tobacco. Limiting alcohol use. What can I expect for my preventive care visit? Physical exam Your health care provider may check your: Height and weight. These may be used to calculate your BMI (body mass index). BMI is a measurement that tells if you are at a healthy weight. Heart rate and blood pressure. Body temperature. Skin for abnormal spots. Counseling Your health care provider may ask you questions about your: Past medical problems. Family's medical history. Alcohol, tobacco, and drug use. Emotional well-being. Home life and relationship well-being. Sexual activity. Diet, exercise, and sleep habits. Work and work environment. Access to firearms. Method of birth control. Menstrual cycle. Pregnancy history. What immunizations do I need? Vaccines are usually given at various ages, according to a schedule. Your health care provider will recommend vaccines for you based on your age, medical history, and lifestyle or other factors, such as travel or where you work. What tests do I need? Blood tests Lipid and cholesterol levels. These may be checked every 5 years starting at age 20. Hepatitis C test. Hepatitis B test. Screening Diabetes screening. This is done by checking your blood sugar (glucose) after you have not eaten for a while (fasting). STD (sexually transmitted disease) testing, if you are at risk. BRCA-related cancer screening. This may be done if you have a family history of breast, ovarian, tubal, or  peritoneal cancers. Pelvic exam and Pap test. This may be done every 3 years starting at age 21. Starting at age 30, this may be done every 5 years if you have a Pap test in combination with an HPV test. Talk with your health care provider about your test results, treatment options, and if necessary, the need for more tests. Follow these instructions at home: Eating and drinking  Eat a healthy diet that includes fresh fruits and vegetables, whole grains, lean protein, and low-fat dairy products. Take vitamin and mineral supplements as recommended by your health care provider. Do not drink alcohol if: Your health care provider tells you not to drink. You are pregnant, may be pregnant, or are planning to become pregnant. If you drink alcohol: Limit how much you have to 0-1 drink a day. Be aware of how much alcohol is in your drink. In the [place] one drink equals one 12 oz bottle of beer (355 mL), one 5 oz glass of wine (148 mL), or one 1 oz glass of hard liquor (44 mL). Lifestyle Take daily care of your teeth and gums. Brush your teeth every morning and night with fluoride toothpaste. Floss one time each day. Stay active. Exercise for at least 30 minutes 5 or more days each week. Do not use any products that contain nicotine or tobacco, such as cigarettes, e-cigarettes, and chewing tobacco. If you need help quitting, ask your health care provider. Do not use drugs. If you are sexually active, practice safe sex. Use a condom or other form of protection to prevent STIs (sexually transmitted infections). If you do not wish to become pregnant, use a form of birth control. If you plan to become pregnant, see your health care provider   for a prepregnancy visit. Find healthy ways to cope with stress, such as: Meditation, yoga, or listening to music. Journaling. Talking to a trusted person. Spending time with friends and family. Safety Always wear your seat belt while driving or riding in a  vehicle. Do not drive: If you have been drinking alcohol. Do not ride with someone who has been drinking. When you are tired or distracted. While texting. Wear a helmet and other protective equipment during sports activities. If you have firearms in your house, make sure you follow all gun safety procedures. Seek help if you have been physically or sexually abused. What's next? Go to your health care provider once a year for an annual wellness visit. Ask your health care provider how often you should have your eyes and teeth checked. Stay up to date on all vaccines. This information is not intended to replace advice given to you by your health care provider. Make sure you discuss any questions you have with your health care provider. Document Revised: 09/20/2020 Document Reviewed: 03/24/2018 Elsevier Patient Education  2022 Elsevier Inc.  

## 2021-03-28 NOTE — Telephone Encounter (Signed)
Positive for BV.  Patient aware.  Treatment sent to pharmacy.  Gerlene Fee, DO 03/28/2021, 1:46 PM PGY-3, [place]

## 2021-03-29 LAB — HIV ANTIBODY (ROUTINE TESTING W REFLEX): HIV Screen 4th Generation wRfx: NONREACTIVE

## 2021-03-29 LAB — RPR: RPR Ser Ql: NONREACTIVE

## 2021-03-29 LAB — HCV AB W REFLEX TO QUANT PCR: HCV Ab: <0.1 s/co ratio (ref 0.0–0.9)

## 2021-03-29 LAB — HCV INTERPRETATION

## 2021-04-01 LAB — CERVICOVAGINAL ANCILLARY ONLY
Chlamydia: NEGATIVE
Comment: NEGATIVE
Comment: NORMAL
Neisseria Gonorrhea: NEGATIVE

## 2021-04-15 ENCOUNTER — Encounter: Payer: Self-pay | Admitting: Radiology

## 2021-06-05 ENCOUNTER — Encounter: Payer: Self-pay | Admitting: Advanced Practice Midwife

## 2021-06-05 ENCOUNTER — Other Ambulatory Visit: Payer: Self-pay

## 2021-06-05 ENCOUNTER — Other Ambulatory Visit (HOSPITAL_COMMUNITY)
Admission: RE | Admit: 2021-06-05 | Discharge: 2021-06-05 | Disposition: A | Discharge status: Home / Self Care | Payer: Medicaid Other | Source: Ambulatory Visit | Attending: Advanced Practice Midwife | Admitting: Advanced Practice Midwife

## 2021-06-05 ENCOUNTER — Ambulatory Visit (INDEPENDENT_AMBULATORY_CARE_PROVIDER_SITE_OTHER): Payer: Medicaid Other | Admitting: Advanced Practice Midwife

## 2021-06-05 VITALS — BP 120/80 | HR 80 | Ht 63.0 in | Wt 212.0 lb

## 2021-06-05 DIAGNOSIS — Z01419 Encounter for gynecological examination (general) (routine) without abnormal findings: Secondary | ICD-10-CM

## 2021-06-05 DIAGNOSIS — Z0000 Encounter for general adult medical examination without abnormal findings: Secondary | ICD-10-CM

## 2021-06-05 DIAGNOSIS — Z975 Presence of (intrauterine) contraceptive device: Secondary | ICD-10-CM

## 2021-06-05 DIAGNOSIS — N898 Other specified noninflammatory disorders of vagina: Secondary | ICD-10-CM

## 2021-06-05 NOTE — Progress Notes (Signed)
GYNECOLOGY ANNUAL PREVENTATIVE CARE ENCOUNTER NOTE  History:     Deborah Rice is a 25 y.o. G9P2002 female here for a routine annual gynecologic exam.  Current complaints: recurrent abnormal vaginal discharge. Has tried Metrogel and boric acid with temporary success. Denies abnormal vaginal bleeding, discharge, pelvic pain, problems with intercourse or other gynecologic concerns.    Patient has been separated for about two years. Works 4 12 hour night shifts per week. New female partner. Endorses shaving pubic hair, no douching, no allergens in hygiene regimen, routine STI screening.  Patient endorses weight gain beyond what she is comfortable with. Intermittent high sugar diet.    Gynecologic History No LMP recorded. Patient has had an implant. Contraception: Nexplanon Last Pap: 03/2019. Result was normal with negative HPV Last Mammogram: N/A.    Obstetric History OB History  Gravida Para Term Preterm AB Living  2 2 2     2   SAB IAB Ectopic Multiple Live Births        0 2    # Outcome Date GA Lbr Len/2nd Weight Sex Delivery Anes PTL Lv  2 Term 03/01/19 [redacted]w[redacted]d 05:37 / 00:03 7 lb 13.8 oz (3.565 kg) M Vag-Spont None  LIV  1 Term 07/28/15   7 lb (3.175 kg) M Vag-Spont  N LIV    Past Medical History:  Diagnosis Date   Asthma    Low TSH level 09/10/2018   Normal 03/29/2019   Neg rpt at 28wks Normal fT4    Past Surgical History:  Procedure Laterality Date   DILATION AND EVACUATION N/A 03/11/2019   Procedure: DILATATION AND EVACUATION;  Surgeon: Mora Bellman, MD;  Location: [facility];  Service: Gynecology;  Laterality: N/A;   [place] EXTRACTION  2014    Current Outpatient Medications on File Prior to Visit  Medication Sig Dispense Refill   etonogestrel (NEXPLANON) 68 MG IMPL implant 1 each by Subdermal route once.     No current facility-administered medications on file prior to visit.    No Known Allergies  Social History:  reports that she has never smoked. She has  never used smokeless tobacco. She reports current alcohol use of about 1.0 standard drink per week. She reports that she does not currently use drugs.  Family History  Problem Relation Age of Onset   Asthma Sister    Asthma Brother    Diabetes Maternal Grandmother     The following portions of the patient's history were reviewed and updated as appropriate: allergies, current medications, past family history, past medical history, past social history, past surgical history and problem list.  Review of Systems Pertinent items noted in HPI and remainder of comprehensive ROS otherwise negative.  Physical Exam:  BP 120/80   Pulse 80   Ht 5\' 3"  (1.6 m)   Wt 212 lb (96.2 kg)   BMI 37.55 kg/m  CONSTITUTIONAL: Well-developed, well-nourished female in no acute distress.  HENT:  Normocephalic, atraumatic, External right and left ear normal.  EYES: Conjunctivae and EOM are normal. Pupils are equal, round, and reactive to light. No scleral icterus.  NECK: Normal range of motion, supple, no masses.  Normal thyroid.  SKIN: Skin is warm and dry. No rash noted. Not diaphoretic. No erythema. No pallor. MUSCULOSKELETAL: Normal range of motion. No tenderness.  No cyanosis, clubbing, or edema. NEUROLOGIC: Alert and oriented to person, place, and time. Normal reflexes, muscle tone coordination.  PSYCHIATRIC: Normal mood and affect. Normal behavior. Normal judgment and thought content. CARDIOVASCULAR: Normal  heart rate noted, regular rhythm RESPIRATORY: Clear to auscultation bilaterally. Effort and breath sounds normal, no problems with respiration noted. BREASTS: Symmetric in size. No masses, tenderness, skin changes, nipple drainage, or lymphadenopathy bilaterally. Heat rash noted along margin of bra/underwire. Performed in the presence of a chaperone. ABDOMEN: Soft, no distention noted.  No tenderness, rebound or guarding.  PELVIC: Normal appearing external genitalia and urethral meatus; CV swab  collected via blind swab. No overt abnormal discharge noted on swab. Performed in the presence of a chaperone.   Assessment and Plan:    1. Well woman exam without gynecological exam - No concerning findings on physical exam - Advised daily lactobacillus supplement - Abstinence during treatment PRN - Cervicovaginal ancillary only  2. Vaginal discharge - Will wait for CV swab, consider UTD extended regimen protocol if positive for BV - Advised OTC Lactobacillus supplement  3. Nexplanon in place - Left upper arm  Will follow up results of vaginal swabs and manage accordingly. Routine preventative health maintenance measures emphasized. Please refer to After Visit Summary for other counseling recommendations.   Total visit time: 30 min. Greater than 50% of visit spent in counseling and coordination of care  Mallie Snooks, [place], MSN, CNM Certified Nurse Midwife, Product/process development scientist for [employer], [place]

## 2021-06-06 ENCOUNTER — Other Ambulatory Visit: Payer: Self-pay | Admitting: Advanced Practice Midwife

## 2021-06-06 ENCOUNTER — Telehealth: Payer: Self-pay | Admitting: Advanced Practice Midwife

## 2021-06-06 DIAGNOSIS — N760 Acute vaginitis: Secondary | ICD-10-CM

## 2021-06-06 DIAGNOSIS — B9689 Other specified bacterial agents as the cause of diseases classified elsewhere: Secondary | ICD-10-CM

## 2021-06-06 LAB — CERVICOVAGINAL ANCILLARY ONLY
Bacterial Vaginitis (gardnerella): POSITIVE — AB
Candida Glabrata: NEGATIVE
Candida Vaginitis: NEGATIVE
Comment: NEGATIVE
Comment: NEGATIVE
Comment: NEGATIVE

## 2021-06-06 MED ORDER — METRONIDAZOLE 500 MG PO TABS
500.0000 mg | ORAL_TABLET | Freq: Two times a day (BID) | ORAL | 0 refills | Status: DC
Start: 2021-06-06 — End: 2021-12-03

## 2021-06-06 NOTE — Progress Notes (Signed)
+   BV. Will try initial treatment with Flagyl and changes to hygiene regimen discussed during office visit. VLTCB  Mallie Snooks, MSA, MSN, CNM Certified Nurse Midwife, Product/process development scientist for [employer], [place]

## 2021-06-06 NOTE — Telephone Encounter (Signed)
VLTCB  Deborah Rice, MSA, MSN, CNM Certified Nurse Midwife, Product/process development scientist for [employer], [place]

## 2021-06-13 ENCOUNTER — Other Ambulatory Visit: Payer: Medicaid Other

## 2021-06-13 ENCOUNTER — Other Ambulatory Visit: Payer: Self-pay

## 2021-06-13 ENCOUNTER — Other Ambulatory Visit: Payer: Self-pay | Admitting: Family Medicine

## 2021-06-13 DIAGNOSIS — E785 Hyperlipidemia, unspecified: Secondary | ICD-10-CM

## 2021-06-24 DIAGNOSIS — R509 Fever, unspecified: Secondary | ICD-10-CM | POA: Diagnosis not present

## 2021-06-24 DIAGNOSIS — Z20822 Contact with and (suspected) exposure to covid-19: Secondary | ICD-10-CM | POA: Diagnosis not present

## 2021-11-28 ENCOUNTER — Encounter: Payer: Self-pay | Admitting: Family Medicine

## 2021-12-02 ENCOUNTER — Ambulatory Visit (INDEPENDENT_AMBULATORY_CARE_PROVIDER_SITE_OTHER): Payer: Medicaid Other

## 2021-12-02 ENCOUNTER — Other Ambulatory Visit (HOSPITAL_COMMUNITY)
Admission: RE | Admit: 2021-12-02 | Discharge: 2021-12-02 | Disposition: A | Discharge status: Home / Self Care | Payer: Medicaid Other | Source: Ambulatory Visit | Attending: Obstetrics & Gynecology | Admitting: Obstetrics & Gynecology

## 2021-12-02 VITALS — BP 139/83 | HR 77

## 2021-12-02 DIAGNOSIS — A749 Chlamydial infection, unspecified: Secondary | ICD-10-CM

## 2021-12-02 DIAGNOSIS — B9689 Other specified bacterial agents as the cause of diseases classified elsewhere: Secondary | ICD-10-CM

## 2021-12-02 DIAGNOSIS — R3915 Urgency of urination: Secondary | ICD-10-CM

## 2021-12-02 DIAGNOSIS — Z113 Encounter for screening for infections with a predominantly sexual mode of transmission: Secondary | ICD-10-CM

## 2021-12-02 DIAGNOSIS — R82998 Other abnormal findings in urine: Secondary | ICD-10-CM

## 2021-12-02 HISTORY — DX: Chlamydial infection, unspecified: A74.9

## 2021-12-02 LAB — POCT URINALYSIS DIPSTICK: Glucose, UA: NEGATIVE

## 2021-12-02 MED ORDER — NITROFURANTOIN MONOHYD MACRO 100 MG PO CAPS: 100.0000 mg | ORAL_CAPSULE | Freq: Two times a day (BID) | ORAL | 0 refills | Status: AC

## 2021-12-02 MED ORDER — PHENAZOPYRIDINE HCL 200 MG PO TABS: 200.0000 mg | ORAL_TABLET | Freq: Three times a day (TID) | ORAL | 1 refills | Status: DC | PRN

## 2021-12-02 NOTE — Progress Notes (Addendum)
SUBJECTIVE:  ?26 y.o. female complains of vaginal pressure for 4 day(s) and urinary urgency ?Denies abnormal vaginal bleeding or significant pelvic pain or ?fever.Denies history of known exposure to STD. ? ?No LMP recorded. Patient has had an implant. ? ?OBJECTIVE:  ?She appears alert, well appearing, in no apparent distress ?Urine dipstick: positive for RBC's and positive for leukocytes. ? ?ASSESSMENT:  ?Vaginal Pressure ?Urinary urgency ? ? ? ?PLAN:  ?GC, chlamydia, trichomonas, BVAG, CVAG probe and urine culture sent to lab. ?Treatment: Macrobid '100mg'$  PO BID for 5 days and Pyridium '200mg'$  PO TID for 2 days.  ?Pt aware antibiotic may change depending on what urine culture shows.  ? ? ?Mickey Farber, CMA ? ? ?Patient was assessed and managed by nursing staff during this encounter. I have reviewed the chart and agree with the documentation and plan. I have also made any necessary editorial changes. ? ?Verita Schneiders, MD ?12/02/2021 3:20 PM  ? ? ? ? ?

## 2021-12-03 ENCOUNTER — Encounter: Payer: Self-pay | Admitting: Obstetrics & Gynecology

## 2021-12-03 LAB — CERVICOVAGINAL ANCILLARY ONLY
Bacterial Vaginitis (gardnerella): POSITIVE — AB
Candida Glabrata: NEGATIVE
Candida Vaginitis: NEGATIVE
Chlamydia: POSITIVE — AB
Comment: NEGATIVE
Comment: NEGATIVE
Comment: NEGATIVE
Comment: NEGATIVE
Comment: NEGATIVE
Comment: NORMAL
Neisseria Gonorrhea: NEGATIVE
Trichomonas: NEGATIVE

## 2021-12-03 MED ORDER — DOXYCYCLINE HYCLATE 100 MG PO CAPS: 100.0000 mg | ORAL_CAPSULE | Freq: Two times a day (BID) | ORAL | 1 refills | Status: AC

## 2021-12-03 MED ORDER — METRONIDAZOLE 500 MG PO TABS: 500.0000 mg | ORAL_TABLET | Freq: Two times a day (BID) | ORAL | 0 refills | Status: AC

## 2021-12-03 NOTE — Progress Notes (Signed)
Patient has chlamydia.  Recommend testing for other STIs, also needs to let partner(s) know so the partner(s) can get testing and treatment. Patient and sex partner(s) should abstain from unprotected sexual activity for seven days after everyone receives appropriate treatment.  Doxycyline was prescribed for patient.  Patient will need to return in about 4 weeks after treatment for repeat test of cure.  Of note, she also has bacterial vaginitis, Metronidazole prescribed also.  Please call to inform patient of results and recommendations, and advise to pick up prescriptions and take as directed.  Please advise patient to practice safe sex at all times. ? ? ?Verita Schneiders, MD ?

## 2021-12-03 NOTE — Addendum Note (Signed)
Addended by: Verita Schneiders A on: 12/03/2021 03:33 PM ? ? Modules accepted: Orders ? ?

## 2021-12-04 ENCOUNTER — Telehealth: Payer: Self-pay | Admitting: *Deleted

## 2021-12-04 NOTE — Telephone Encounter (Signed)
Called pt to make sure she results and note from Dr A and see if she had any further questions. Made 4 weeks TOC appt  ?

## 2021-12-04 NOTE — Telephone Encounter (Signed)
-----   Message from Osborne Oman, MD sent at 12/03/2021  3:32 PM EDT ----- ?Patient has chlamydia.  Recommend testing for other STIs, also needs to let partner(s) know so the partner(s) can get testing and treatment. Patient and sex partner(s) should abstain from unprotected sexual activity for seven days after everyone receives appropriate treatment.  Doxycyline was prescribed for patient.  Patient will need to return in about 4 weeks after treatment for repeat test of cure.  Of note, she also has bacterial vaginitis, Metronidazole prescribed also.  Please call to inform patient of results and recommendations, and advise to pick up prescriptions and take as directed.  Please advise patient to practice safe sex at all times. ? ? ?Deborah Schneiders, MD ? ?

## 2021-12-05 LAB — URINE CULTURE: Organism ID, Bacteria: NO GROWTH

## 2021-12-08 ENCOUNTER — Other Ambulatory Visit (HOSPITAL_COMMUNITY)
Admission: RE | Admit: 2021-12-08 | Discharge: 2021-12-08 | Disposition: A | Discharge status: Home / Self Care | Payer: Medicaid Other | Source: Ambulatory Visit | Attending: Obstetrics & Gynecology | Admitting: Obstetrics & Gynecology

## 2021-12-08 ENCOUNTER — Ambulatory Visit (INDEPENDENT_AMBULATORY_CARE_PROVIDER_SITE_OTHER): Payer: Medicaid Other

## 2021-12-08 VITALS — BP 143/83 | HR 69

## 2021-12-08 DIAGNOSIS — A749 Chlamydial infection, unspecified: Secondary | ICD-10-CM

## 2021-12-08 NOTE — Progress Notes (Signed)
SUBJECTIVE:  ?26 y.o. female wants to repeat Self swab. Pt had + CT on 12/02/21 swab. Has already started Treatment Medication. ?Denies abnormal vaginal bleeding or significant pelvic pain or ?fever. No UTI symptoms.  ?No LMP recorded. Patient has had an implant. ? ?OBJECTIVE:  ?She appears well, afebrile. ?Urine dipstick: not done. ? ?ASSESSMENT:  ?Had + CT Results partner was Negative. Patient wants retest.  ? ? ?PLAN:  ?GC, chlamydia, trichomonas, BVAG, CVAG probe sent to lab. ?Treatment: To be determined once lab results are received ?ROV prn if symptoms persist or worsen.  ?

## 2021-12-09 LAB — CERVICOVAGINAL ANCILLARY ONLY
Bacterial Vaginitis (gardnerella): POSITIVE — AB
Candida Glabrata: NEGATIVE
Candida Vaginitis: NEGATIVE
Chlamydia: POSITIVE — AB
Comment: NEGATIVE
Comment: NEGATIVE
Comment: NEGATIVE
Comment: NEGATIVE
Comment: NEGATIVE
Comment: NORMAL
Neisseria Gonorrhea: NEGATIVE
Trichomonas: NEGATIVE

## 2021-12-15 DIAGNOSIS — F331 Major depressive disorder, recurrent, moderate: Secondary | ICD-10-CM | POA: Diagnosis not present

## 2021-12-18 DIAGNOSIS — F331 Major depressive disorder, recurrent, moderate: Secondary | ICD-10-CM | POA: Diagnosis not present

## 2021-12-25 DIAGNOSIS — F331 Major depressive disorder, recurrent, moderate: Secondary | ICD-10-CM | POA: Diagnosis not present

## 2021-12-30 ENCOUNTER — Encounter: Payer: Self-pay | Admitting: *Deleted

## 2022-01-01 DIAGNOSIS — F331 Major depressive disorder, recurrent, moderate: Secondary | ICD-10-CM | POA: Diagnosis not present

## 2022-01-07 ENCOUNTER — Ambulatory Visit (INDEPENDENT_AMBULATORY_CARE_PROVIDER_SITE_OTHER): Payer: Medicaid Other

## 2022-01-07 ENCOUNTER — Other Ambulatory Visit (HOSPITAL_COMMUNITY)
Admission: RE | Admit: 2022-01-07 | Discharge: 2022-01-07 | Disposition: A | Discharge status: Home / Self Care | Payer: Medicaid Other | Source: Ambulatory Visit | Attending: Obstetrics and Gynecology | Admitting: Obstetrics and Gynecology

## 2022-01-07 VITALS — BP 123/77 | HR 64

## 2022-01-07 DIAGNOSIS — A749 Chlamydial infection, unspecified: Secondary | ICD-10-CM | POA: Diagnosis not present

## 2022-01-07 NOTE — Progress Notes (Signed)
SUBJECTIVE:  26 y.o. female who desires a STI screen. Denies abnormal vaginal discharge, bleeding or significant pelvic pain. No UTI symptoms. Denies history of known exposure to STD.  No LMP recorded. Patient has had an implant.  OBJECTIVE:  She appears well.   ASSESSMENT:  STI Screen   PLAN:  Pt offered STI blood screening-not indicated GC, chlamydia, and trichomonas probe sent to lab. Pt also wants BV and yeast testing Treatment: To be determined once lab results are received.  Pt follow up as needed.

## 2022-01-08 DIAGNOSIS — F331 Major depressive disorder, recurrent, moderate: Secondary | ICD-10-CM | POA: Diagnosis not present

## 2022-01-09 LAB — CERVICOVAGINAL ANCILLARY ONLY
Bacterial Vaginitis (gardnerella): NEGATIVE
Candida Glabrata: POSITIVE — AB
Candida Vaginitis: NEGATIVE
Chlamydia: NEGATIVE
Comment: NEGATIVE
Comment: NEGATIVE
Comment: NEGATIVE
Comment: NEGATIVE
Comment: NEGATIVE
Comment: NORMAL
Neisseria Gonorrhea: NEGATIVE
Trichomonas: NEGATIVE

## 2022-01-15 DIAGNOSIS — F331 Major depressive disorder, recurrent, moderate: Secondary | ICD-10-CM | POA: Diagnosis not present

## 2022-01-22 DIAGNOSIS — F331 Major depressive disorder, recurrent, moderate: Secondary | ICD-10-CM | POA: Diagnosis not present

## 2022-01-29 DIAGNOSIS — F331 Major depressive disorder, recurrent, moderate: Secondary | ICD-10-CM | POA: Diagnosis not present

## 2022-02-05 DIAGNOSIS — F331 Major depressive disorder, recurrent, moderate: Secondary | ICD-10-CM | POA: Diagnosis not present

## 2022-02-19 DIAGNOSIS — F331 Major depressive disorder, recurrent, moderate: Secondary | ICD-10-CM | POA: Diagnosis not present

## 2022-02-27 DIAGNOSIS — F331 Major depressive disorder, recurrent, moderate: Secondary | ICD-10-CM | POA: Diagnosis not present

## 2022-03-09 ENCOUNTER — Telehealth: Payer: Self-pay

## 2022-03-09 NOTE — Telephone Encounter (Signed)
Called pt to offer sooner appt. Left message to call office back.

## 2022-03-10 ENCOUNTER — Ambulatory Visit (INDEPENDENT_AMBULATORY_CARE_PROVIDER_SITE_OTHER): Payer: Medicaid Other | Admitting: Obstetrics and Gynecology

## 2022-03-10 ENCOUNTER — Encounter: Payer: Self-pay | Admitting: Obstetrics and Gynecology

## 2022-03-10 VITALS — BP 140/95 | HR 96 | Wt 207.0 lb

## 2022-03-10 DIAGNOSIS — Z3046 Encounter for surveillance of implantable subdermal contraceptive: Secondary | ICD-10-CM | POA: Diagnosis not present

## 2022-03-10 DIAGNOSIS — Z30017 Encounter for initial prescription of implantable subdermal contraceptive: Secondary | ICD-10-CM | POA: Diagnosis not present

## 2022-03-10 HISTORY — PX: INSERTION OF IMPLANON ROD: OBO 1005

## 2022-03-10 HISTORY — PX: REMOVAL OF IMPLANON ROD: OBO 1006

## 2022-03-10 MED ORDER — ETONOGESTREL 68 MG ~~LOC~~ IMPL
68.0000 mg | DRUG_IMPLANT | Freq: Once | SUBCUTANEOUS | Status: AC
  Administered 2022-03-10: 68 mg via SUBCUTANEOUS

## 2022-03-10 NOTE — Procedures (Addendum)
Nexplanon Removal and Insertion Procedure Note UPT negative. Rare periods with nexplanon (placed August 2020)  Prior to the procedure being performed, the patient was asked to state their full name, date of birth, type of procedure being performed and the exact location of the operative site. This information was then checked against the documentation in the patient's chart. Prior to the procedure being performed, a "time out" was performed by the physician that confirmed the correct patient, procedure and site.  After informed consent was obtained, the patient's left arm was examined and the Nexplanon rod was noted to be easily palpable. The area was cleaned with alcohol then local anesthesia was infiltrated with 3 ml of 1% lidocaine with epinephrine. The area was prepped with betadine. Using sterile technique, the old incision was cut with an 11 blade scalpel, and the Nexplanon device was brought to the incision site. The capsule was scrapped off with the scalpel, the Nexplanon grasped with hemostats, and easily removed; the removal site was hemostatic. The Nexplanon was inspected and noted to be intact.   The old site was approximately 8 cm proximal to the medial epicondyle at the area of the triceps on the lower surface (newer insertion site area). The area was cleaned with betadine and then local anesthesia was infiltrated with 3 ml of 1% lidocaine along the planned insertion track. The area was prepped with betadine. Using sterile technique the Nexplanon device was inserted per manufacturer's guidelines in the subdermal connective tissue using the standard insertion technique without difficulty. Pressure was applied and the insertion site was hemostatic. The presence of the Nexplanon was confirmed immediately after insertion by palpation by both me and the patient and by checking the tip of needle for the absence of the insert.  A pressure dressing was applied.  The patient tolerated the procedure  well. I told her to wait a week before considering it effective.   RTC 3 months for annual and pap smear  Durene Romans MD Attending Center for [facility])

## 2022-03-10 NOTE — Progress Notes (Signed)
Nexplanon Removal reinsertion  Inserted 03/2019 Left arm

## 2022-03-12 DIAGNOSIS — F331 Major depressive disorder, recurrent, moderate: Secondary | ICD-10-CM | POA: Diagnosis not present

## 2022-03-17 ENCOUNTER — Ambulatory Visit: Payer: Medicaid Other | Admitting: Obstetrics and Gynecology

## 2022-03-17 ENCOUNTER — Ambulatory Visit (INDEPENDENT_AMBULATORY_CARE_PROVIDER_SITE_OTHER): Payer: Medicaid Other | Admitting: Obstetrics and Gynecology

## 2022-03-17 VITALS — BP 123/80 | HR 65 | Wt 209.0 lb

## 2022-03-17 DIAGNOSIS — Z3046 Encounter for surveillance of implantable subdermal contraceptive: Secondary | ICD-10-CM

## 2022-03-17 NOTE — Progress Notes (Unsigned)
  Obstetrics and Gynecology Visit Return Patient Evaluation  Appointment Date: 03/17/2022  Primary Care Provider: [place]: [facility] for [facility]  Chief Complaint: nexplanon side effects  History of Present Illness:  Deborah Rice is a 26 y.o. had a her old nexplanon removed and new one placed a week ago. Since then, she's had headaches as well as pain and some numbness and tingling in that LUE  Review of Systems: as noted in the History of Present Illness.   Patient Active Problem List   Diagnosis Date Noted   Chlamydia 12/02/2021   Anxiety 04/06/2019   Depression 04/06/2019   Nexplanon insertion 03/29/2019   BMI 30s 08/23/2018   Medications:  Corinne Goucher had no medications administered during this visit. Current Outpatient Medications  Medication Sig Dispense Refill   etonogestrel (NEXPLANON) 68 MG IMPL implant 1 each by Subdermal route once.     phenazopyridine (PYRIDIUM) 200 MG tablet Take 1 tablet (200 mg total) by mouth 3 (three) times daily as needed for pain (urethral spasm). (Patient not taking: Reported on 03/10/2022) 10 tablet 1   No current facility-administered medications for this visit.    Allergies: has No Known Allergies.  Physical Exam:  BP 123/80   Pulse 65   Wt 209 lb (94.8 kg)   BMI 37.02 kg/m  Body mass index is 37.02 kg/m. General appearance: Well nourished, well developed female in no acute distress.  Neuro/Psych:  Normal mood and affect.    See procedure note for uncomplicated nexplanon removal   Assessment: pt stable  Plan: The nexplanon was placed in the same spot as the old nexplanon which is in the "newer" area on the lower part of the arm near the triceps. I told her that I've had to take a a handful of these that were placed in that area by other clinics and I don't prefer to place them there and recommend in the sulcal area b/w the biceps and triceps. I told her we can place another one in  that area but the headaches are likely from getting a fresh nexplanon so that would continue. She decided on not getting another nexplanon. Other options d/w her and she's used a mirena in the past but had similar HAs with that. R/b/a d/w her and she has no contraindications to estrogen. I told her I'd lean towards paragard as she wants a LARC. Brochures for this, lo loestrin and Kylenna given to her. Pt to use condoms in the interim  RTC: 1-32mfor annual and pap  CDurene RomansMD Attending Center for [employer]

## 2022-03-17 NOTE — Progress Notes (Unsigned)
Return patient here to F/U on Nexplanon Insertion. Notes numbness and tingling in hand and fingers discomfort with moving arm. Also has HA/s since insertion taking Excedrin.

## 2022-03-18 ENCOUNTER — Encounter: Payer: Self-pay | Admitting: Obstetrics and Gynecology

## 2022-03-18 HISTORY — PX: REMOVAL OF IMPLANON ROD: OBO 1006

## 2022-03-18 NOTE — Procedures (Addendum)
Nexplanon Removal Procedure Note After informed consent was obtained, the patient's left arm was examined and the Nexplanon rod was noted to be easily palpable at the insertion site in the subcutaenous area. The area was cleaned with alcohol then local anesthesia was infiltrated with 3 ml of 1% lidocaine with epinephrine. The area was prepped with betadine. Using sterile technique, an 11 blade was used to make an incision, and the Nexplanon device was brought to the incision site. The capsule was scrapped off with the scalpel, the Nexplanon grasped with hemostats, and easily removed; the removal site was hemostatic. The Nexplanon was inspected and noted to be intact.  A steri-strip and a pressure dressing was applied.  The patient tolerated the procedure well.  Durene Romans MD Attending Center for [employer])

## 2022-03-19 ENCOUNTER — Encounter: Payer: Self-pay | Admitting: Obstetrics and Gynecology

## 2022-03-24 ENCOUNTER — Other Ambulatory Visit: Payer: Self-pay | Admitting: Obstetrics and Gynecology

## 2022-03-24 MED ORDER — LO LOESTRIN FE 1 MG-10 MCG / 10 MCG PO TABS: 1.0000 | ORAL_TABLET | Freq: Every day | ORAL | 3 refills | Status: DC

## 2022-03-25 ENCOUNTER — Other Ambulatory Visit (HOSPITAL_COMMUNITY)
Admission: RE | Admit: 2022-03-25 | Discharge: 2022-03-25 | Disposition: A | Discharge status: Home / Self Care | Payer: Medicaid Other | Source: Ambulatory Visit | Attending: Family Medicine | Admitting: Family Medicine

## 2022-03-25 ENCOUNTER — Ambulatory Visit (INDEPENDENT_AMBULATORY_CARE_PROVIDER_SITE_OTHER): Payer: Medicaid Other | Admitting: *Deleted

## 2022-03-25 VITALS — BP 126/89 | HR 55

## 2022-03-25 DIAGNOSIS — R300 Dysuria: Secondary | ICD-10-CM

## 2022-03-25 DIAGNOSIS — N898 Other specified noninflammatory disorders of vagina: Secondary | ICD-10-CM

## 2022-03-25 DIAGNOSIS — Z113 Encounter for screening for infections with a predominantly sexual mode of transmission: Secondary | ICD-10-CM | POA: Diagnosis not present

## 2022-03-25 LAB — POCT URINALYSIS DIPSTICK: Leukocytes, UA: NEGATIVE

## 2022-03-25 MED ORDER — FLUCONAZOLE 150 MG PO TABS: 150.0000 mg | ORAL_TABLET | Freq: Once | ORAL | 3 refills | Status: AC

## 2022-03-25 NOTE — Progress Notes (Addendum)
Patient seen and assessed by nursing staff.  Agree with documentation and plan. Findings c/w Candida glabrata--extended course of diflucan sent in.

## 2022-03-25 NOTE — Progress Notes (Cosign Needed)
SUBJECTIVE:  26 y.o. female complains of white discharge and vaginal erythema noted for few day(s) and dysuria Denies abnormal vaginal bleeding or significant pelvic pain or fever.Denies history of known exposure to STD.  No LMP recorded.  OBJECTIVE:  She appears alert, well appearing, in no apparent distress Urine dipstick: positive for RBC's.  ASSESSMENT:  Vaginal Discharge  Vaginal Odor Dysuria    PLAN:  GC, chlamydia, trichomonas, BVAG, CVAG probe and urine culture sent to lab. Treatment: Will send in diflucan, but will await results for further treatment.   ROV prn if symptoms persist or worsen.   Crosby Oyster, RN

## 2022-03-26 DIAGNOSIS — F331 Major depressive disorder, recurrent, moderate: Secondary | ICD-10-CM | POA: Diagnosis not present

## 2022-03-26 LAB — CERVICOVAGINAL ANCILLARY ONLY
Bacterial Vaginitis (gardnerella): NEGATIVE
Candida Glabrata: POSITIVE — AB
Candida Vaginitis: NEGATIVE
Chlamydia: NEGATIVE
Comment: NEGATIVE
Comment: NEGATIVE
Comment: NEGATIVE
Comment: NEGATIVE
Comment: NEGATIVE
Comment: NORMAL
Neisseria Gonorrhea: NEGATIVE
Trichomonas: NEGATIVE

## 2022-03-26 MED ORDER — FLUCONAZOLE 150 MG PO TABS: 150.0000 mg | ORAL_TABLET | ORAL | 0 refills | Status: AC

## 2022-03-26 NOTE — Addendum Note (Signed)
Addended by: Donnamae Jude on: 03/26/2022 01:10 PM   Modules accepted: Orders

## 2022-03-27 LAB — URINE CULTURE

## 2022-04-08 ENCOUNTER — Ambulatory Visit (INDEPENDENT_AMBULATORY_CARE_PROVIDER_SITE_OTHER): Payer: Medicaid Other

## 2022-04-08 DIAGNOSIS — Z111 Encounter for screening for respiratory tuberculosis: Secondary | ICD-10-CM | POA: Diagnosis not present

## 2022-04-08 NOTE — Progress Notes (Signed)
Patient is here for a PPD placement.  PPD placed in left forearm @ 0940 am.  Patient will return 04/10/2022 to have PPD read. Talbot Grumbling, RN

## 2022-04-10 ENCOUNTER — Ambulatory Visit (INDEPENDENT_AMBULATORY_CARE_PROVIDER_SITE_OTHER): Payer: Medicaid Other

## 2022-04-10 DIAGNOSIS — Z111 Encounter for screening for respiratory tuberculosis: Secondary | ICD-10-CM

## 2022-04-10 DIAGNOSIS — F331 Major depressive disorder, recurrent, moderate: Secondary | ICD-10-CM | POA: Diagnosis not present

## 2022-04-10 LAB — TB SKIN TEST
Induration: 0 mm
TB Skin Test: NEGATIVE

## 2022-04-10 NOTE — Progress Notes (Signed)
Patient is here for a PPD read.  It was placed on 04/08/2022 in the left forearm @ 0940 am.    PPD RESULTS:  Result: negative Induration: 0 mm  Letter created and given to patient for documentation purposes. Talbot Grumbling, RN

## 2022-04-23 DIAGNOSIS — F331 Major depressive disorder, recurrent, moderate: Secondary | ICD-10-CM | POA: Diagnosis not present

## 2022-04-29 ENCOUNTER — Encounter: Payer: Self-pay | Admitting: Obstetrics and Gynecology

## 2022-05-01 ENCOUNTER — Ambulatory Visit: Payer: Medicaid Other | Admitting: Student

## 2022-05-01 ENCOUNTER — Encounter: Payer: Self-pay | Admitting: Student

## 2022-05-01 VITALS — BP 121/79 | HR 67 | Ht 63.5 in | Wt 206.8 lb

## 2022-05-01 DIAGNOSIS — Z23 Encounter for immunization: Secondary | ICD-10-CM

## 2022-05-01 DIAGNOSIS — Z0000 Encounter for general adult medical examination without abnormal findings: Secondary | ICD-10-CM | POA: Diagnosis not present
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
## 2022-05-01 NOTE — Patient Instructions (Signed)
It was great to see you! Thank you for allowing me to participate in your care!   Our plans for today:  - Weightloss - Work on plate portions/ 1/2 plate of vegetables, 1/4 plate carbs, 1/4 meat or protein - Work towards eating frequent small meals 3 times a day, with snacks inbetween. Snacks: High in protein, low in sugar  - Can follow up as needed in few months - Sleep Try to have a sleep routine, habits you do before bed, and to got bed at the same time, whenever on days or nights.  Avoid exercise before bed   Take care and seek immediate care sooner if you develop any concerns.   Dr. Holley Bouche, MD [place]

## 2022-05-07 DIAGNOSIS — F331 Major depressive disorder, recurrent, moderate: Secondary | ICD-10-CM | POA: Diagnosis not present

## 2022-05-13 ENCOUNTER — Ambulatory Visit (INDEPENDENT_AMBULATORY_CARE_PROVIDER_SITE_OTHER): Payer: Medicaid Other | Admitting: Student

## 2022-05-13 DIAGNOSIS — R197 Diarrhea, unspecified: Secondary | ICD-10-CM | POA: Diagnosis not present

## 2022-05-13 NOTE — Patient Instructions (Signed)
It was great seeing you today.  Continue to hydrate with water, electrolyte solution. Stay home until your diarrhea resolves.  Let us know if your symptoms do not resolve in a week   If you have any questions or concerns, please feel free to call the clinic.    Be well,  Dr. Orvis Brill [facility] [phone]

## 2022-05-13 NOTE — Assessment & Plan Note (Addendum)
Patient with 4 days of acute nonbloody diarrhea.  Otherwise, no other sick symptoms.  She says she is starting to feel little bit better generally well-appearing and well-hydrated on exam.  Benign abdominal exam. No red flag symptoms including recent antibiotic use, prolonged symptoms greater than 7 to 10 days. Likely viral gastroenteritis.  No concern for SBO, ischemic colitis, invasive or inflammatory diarrhea. Discussed symptomatic management and focus on hydration. Discussed return precautions including severe abdominal pain, blood in stool, symptoms lasting longer than 10 days. Provided work note to remain home until diarrhea free for 24 hours.

## 2022-05-13 NOTE — Progress Notes (Signed)
    SUBJECTIVE:   CHIEF COMPLAINT / HPI:   Caley is a 26 year old female here for 4 days of diarrhea.  Symptoms started on Saturday with yellowish loose stools, then developed chills that evening.  Also having lower abdominal pain and headaches.  No fevers and no vomiting.  No blood in stool.  No dysuria.  Denies any new foods or recent antibiotics.  Has been taking Tylenol for her pain, but otherwise no other medications. 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  She says that "something is going around at school."  She says that she is starting to feel just a little bit better today.  PERTINENT  PMH / PSH: Reviewed  OBJECTIVE:   BP 122/78   Pulse 62   Wt 201 lb (91.2 kg)   LMP 01/24/2022   SpO2 99%   Breastfeeding No   BMI 35.05 kg/m   General: Alert and cooperative and appears to be in no acute distress HEENT: Mucous membranes moist.  Tonsillar enlargement with erythema, but no exudates. Cardio: Normal S1 and S2, no S3 or S4. Rhythm is regular. No murmurs or rubs.   Pulm: Clear to auscultation bilaterally, no crackles, wheezing, or diminished breath sounds. Normal respiratory effort Abdomen: Bowel sounds normal. Abdomen soft, mild tenderness diffusely. Extremities: No peripheral edema. Warm/ well perfused.  Strong radial pulses. Neuro: Cranial nerves grossly intact  ASSESSMENT/PLAN:   Diarrhea Patient with 4 days of acute nonbloody diarrhea.  Otherwise, no other sick symptoms.  She says she is starting to feel little bit better generally well-appearing and well-hydrated on exam.  Benign abdominal exam. No red flag symptoms including recent antibiotic use, prolonged symptoms greater than 7 to 10 days. Likely viral gastroenteritis.  No concern for SBO, ischemic colitis, invasive or inflammatory diarrhea. Discussed symptomatic management and focus on hydration. Discussed return precautions including severe abdominal pain, blood in stool, symptoms  lasting longer than 10 days. Provided work note to remain home until diarrhea free for 24 hours.     Orvis Brill, [place]

## 2022-05-22 DIAGNOSIS — F331 Major depressive disorder, recurrent, moderate: Secondary | ICD-10-CM | POA: Diagnosis not present

## 2022-05-27 ENCOUNTER — Ambulatory Visit (INDEPENDENT_AMBULATORY_CARE_PROVIDER_SITE_OTHER): Payer: Medicaid Other | Admitting: Family Medicine

## 2022-05-27 ENCOUNTER — Encounter: Payer: Self-pay | Admitting: Family Medicine

## 2022-05-27 ENCOUNTER — Other Ambulatory Visit (HOSPITAL_COMMUNITY)
Admission: RE | Admit: 2022-05-27 | Discharge: 2022-05-27 | Disposition: A | Discharge status: Home / Self Care | Payer: Medicaid Other | Source: Ambulatory Visit | Attending: Family Medicine | Admitting: Family Medicine

## 2022-05-27 VITALS — BP 131/89 | HR 71 | Ht 63.0 in | Wt 208.0 lb

## 2022-05-27 DIAGNOSIS — Z3041 Encounter for surveillance of contraceptive pills: Secondary | ICD-10-CM | POA: Diagnosis not present

## 2022-05-27 DIAGNOSIS — Z0000 Encounter for general adult medical examination without abnormal findings: Secondary | ICD-10-CM | POA: Diagnosis not present

## 2022-05-27 DIAGNOSIS — Z01419 Encounter for gynecological examination (general) (routine) without abnormal findings: Secondary | ICD-10-CM

## 2022-05-27 MED ORDER — LO LOESTRIN FE 1 MG-10 MCG / 10 MCG PO TABS: 1.0000 | ORAL_TABLET | Freq: Every day | ORAL | 3 refills | Status: DC

## 2022-05-27 NOTE — Progress Notes (Signed)
   GYNECOLOGY ANNUAL PREVENTATIVE CARE ENCOUNTER NOTE  Subjective:   Deborah Rice is a 26 y.o. G43P2002 female here for a routine annual gynecologic exam.  Current complaints: none, doing well. Likes OCP. Previously on IUD, Nexplanon and OCP.   Denies abnormal vaginal bleeding, discharge, pelvic pain, problems with intercourse or other gynecologic concerns.    Gynecologic History Patient's last menstrual period was 05/17/2022 (exact date). Contraception: OCP (estrogen/progesterone) Last Pap: 2020. Results were: normal Last mammogram: NA.   Health Maintenance Due  Topic Date Due   COVID-19 Vaccine (3 - Pfizer series) 03/07/2020   PAP-Cervical Cytology Screening  03/28/2022   PAP SMEAR-Modifier  03/28/2022    The following portions of the patient's history were reviewed and updated as appropriate: allergies, current medications, past family history, past medical history, past social history, past surgical history and problem list.  Review of Systems Pertinent items are noted in HPI.   Objective:  BP 131/89   Pulse 71   Ht '5\' 3"'$  (1.6 m)   Wt 208 lb (94.3 kg)   LMP 05/17/2022 (Exact Date)   BMI 36.85 kg/m  CONSTITUTIONAL: Well-developed, well-nourished female in no acute distress.  HENT:  Normocephalic, atraumatic, External right and left ear normal. Oropharynx is clear and moist EYES:  No scleral icterus.  NECK: Normal range of motion, supple, no masses.  Normal thyroid.  SKIN: Skin is warm and dry. No rash noted. Not diaphoretic. No erythema. No pallor. NEUROLOGIC: Alert and oriented to person, place, and time. Normal reflexes, muscle tone coordination. No cranial nerve deficit noted. PSYCHIATRIC: Normal mood and affect. Normal behavior. Normal judgment and thought content. CARDIOVASCULAR: Normal heart rate noted, regular rhythm. 2+ distal pulses. RESPIRATORY: Effort and breath sounds normal, no problems with respiration noted. BREASTS: Symmetric in size. No masses, skin  changes, nipple drainage, or lymphadenopathy. ABDOMEN: Soft,  no distention noted.  No tenderness, rebound or guarding.  PELVIC: Normal appearing external genitalia; normal appearing vaginal mucosa and cervix.  No abnormal discharge noted.  Pap smear obtained.  Normal uterine size, no other palpable masses, no uterine or adnexal tenderness. MUSCULOSKELETAL: Normal range of motion.      Assessment and Plan:  1) Annual gynecologic examination with pap smear:  Will follow up results of pap smear and manage accordingly. STI screen also ordered today- vaginal only, declined bloodwork.  Routine preventative health maintenance measures emphasized.  2) Contraception counseling: Likes OCP. Wants to continue. Given 1 year rx from today  1. Well woman exam with routine gynecological exam CBE WNL STI screening off pap - Cytology - PAP( [facility]) - LO LOESTRIN FE 1 MG-10 MCG / 10 MCG tablet; Take 1 tablet by mouth daily.  Dispense: 90 tablet; Refill: 3  2. Encounter for surveillance of contraceptive pills - LO LOESTRIN FE 1 MG-10 MCG / 10 MCG tablet; Take 1 tablet by mouth daily.  Dispense: 90 tablet; Refill: 3     Please refer to After Visit Summary for other counseling recommendations.   No follow-ups on file.  Caren Macadam, MD, MPH, ABFM Attending Physician Center for [facility]

## 2022-05-27 NOTE — Progress Notes (Signed)
Annual   STD Screening: GC/CT and Trich  Last pap:03/2019 WNL  LMP: 05/17/22-05/20/22  Contraception : Pills   CC: None

## 2022-05-28 LAB — CYTOLOGY - PAP
Chlamydia: NEGATIVE
Comment: NEGATIVE
Comment: NEGATIVE
Comment: NORMAL
Diagnosis: NEGATIVE
Neisseria Gonorrhea: NEGATIVE
Trichomonas: NEGATIVE

## 2022-06-04 DIAGNOSIS — F331 Major depressive disorder, recurrent, moderate: Secondary | ICD-10-CM | POA: Diagnosis not present

## 2022-06-17 DIAGNOSIS — F331 Major depressive disorder, recurrent, moderate: Secondary | ICD-10-CM | POA: Diagnosis not present

## 2022-07-03 DIAGNOSIS — F331 Major depressive disorder, recurrent, moderate: Secondary | ICD-10-CM | POA: Diagnosis not present

## 2022-08-27 DIAGNOSIS — F331 Major depressive disorder, recurrent, moderate: Secondary | ICD-10-CM | POA: Diagnosis not present

## 2022-09-10 DIAGNOSIS — F331 Major depressive disorder, recurrent, moderate: Secondary | ICD-10-CM | POA: Diagnosis not present

## 2022-09-24 DIAGNOSIS — F331 Major depressive disorder, recurrent, moderate: Secondary | ICD-10-CM | POA: Diagnosis not present

## 2022-10-22 DIAGNOSIS — F331 Major depressive disorder, recurrent, moderate: Secondary | ICD-10-CM | POA: Diagnosis not present

## 2022-10-29 ENCOUNTER — Ambulatory Visit: Payer: Medicaid Other | Admitting: Student

## 2022-10-29 NOTE — Progress Notes (Deleted)
  SUBJECTIVE:   CHIEF COMPLAINT / HPI:   Wanting to discuss weight management  PERTINENT  PMH / PSH: ***  Past Medical History:  Diagnosis Date   Asthma    Chlamydia 12/02/2021   Low TSH level 09/10/2018   Normal 03/29/2019   Neg rpt at 28wks Normal fT4    Patient Care Team: Holley Bouche, MD as PCP - General (Family Medicine) OBJECTIVE:  There were no vitals taken for this visit. Physical Exam   ASSESSMENT/PLAN:  There are no diagnoses linked to this encounter. No follow-ups on file. Holley Bouche, MD 10/29/2022, 7:50 AM PGY-***, [facility] {    This will disappear when note is signed, click to select method of visit    :1}

## 2022-12-10 DIAGNOSIS — F331 Major depressive disorder, recurrent, moderate: Secondary | ICD-10-CM | POA: Diagnosis not present

## 2022-12-17 DIAGNOSIS — F331 Major depressive disorder, recurrent, moderate: Secondary | ICD-10-CM | POA: Diagnosis not present

## 2022-12-30 ENCOUNTER — Encounter: Payer: Self-pay | Admitting: Student

## 2022-12-31 DIAGNOSIS — F331 Major depressive disorder, recurrent, moderate: Secondary | ICD-10-CM | POA: Diagnosis not present

## 2023-01-11 ENCOUNTER — Telehealth: Payer: Self-pay

## 2023-01-11 NOTE — Telephone Encounter (Signed)
Patient has been informed via phone call that her paperwork is ready for pick up. Penni Bombard CMA

## 2023-01-14 DIAGNOSIS — F411 Generalized anxiety disorder: Secondary | ICD-10-CM | POA: Diagnosis not present

## 2023-01-14 DIAGNOSIS — F331 Major depressive disorder, recurrent, moderate: Secondary | ICD-10-CM | POA: Diagnosis not present

## 2023-02-12 DIAGNOSIS — F411 Generalized anxiety disorder: Secondary | ICD-10-CM | POA: Diagnosis not present

## 2023-02-12 DIAGNOSIS — F331 Major depressive disorder, recurrent, moderate: Secondary | ICD-10-CM | POA: Diagnosis not present

## 2023-03-02 DIAGNOSIS — F331 Major depressive disorder, recurrent, moderate: Secondary | ICD-10-CM | POA: Diagnosis not present

## 2023-03-02 DIAGNOSIS — F411 Generalized anxiety disorder: Secondary | ICD-10-CM | POA: Diagnosis not present

## 2023-03-17 DIAGNOSIS — F411 Generalized anxiety disorder: Secondary | ICD-10-CM | POA: Diagnosis not present

## 2023-03-17 DIAGNOSIS — F331 Major depressive disorder, recurrent, moderate: Secondary | ICD-10-CM | POA: Diagnosis not present

## 2023-03-31 DIAGNOSIS — F411 Generalized anxiety disorder: Secondary | ICD-10-CM | POA: Diagnosis not present

## 2023-03-31 DIAGNOSIS — F331 Major depressive disorder, recurrent, moderate: Secondary | ICD-10-CM | POA: Diagnosis not present

## 2023-04-26 DIAGNOSIS — F331 Major depressive disorder, recurrent, moderate: Secondary | ICD-10-CM | POA: Diagnosis not present

## 2023-04-26 DIAGNOSIS — F411 Generalized anxiety disorder: Secondary | ICD-10-CM | POA: Diagnosis not present

## 2023-05-12 DIAGNOSIS — F411 Generalized anxiety disorder: Secondary | ICD-10-CM | POA: Diagnosis not present

## 2023-05-12 DIAGNOSIS — F331 Major depressive disorder, recurrent, moderate: Secondary | ICD-10-CM | POA: Diagnosis not present

## 2023-05-24 DIAGNOSIS — F411 Generalized anxiety disorder: Secondary | ICD-10-CM | POA: Diagnosis not present

## 2023-05-24 DIAGNOSIS — F331 Major depressive disorder, recurrent, moderate: Secondary | ICD-10-CM | POA: Diagnosis not present

## 2023-05-25 ENCOUNTER — Ambulatory Visit: Payer: Medicaid Other | Admitting: Dermatology

## 2023-05-26 ENCOUNTER — Ambulatory Visit: Payer: Medicaid Other | Admitting: Dermatology

## 2023-05-26 ENCOUNTER — Encounter: Payer: Self-pay | Admitting: Dermatology

## 2023-05-26 VITALS — BP 130/85 | HR 76

## 2023-05-26 DIAGNOSIS — L309 Dermatitis, unspecified: Secondary | ICD-10-CM | POA: Diagnosis not present

## 2023-05-26 NOTE — Progress Notes (Unsigned)
   New Patient Visit   Subjective  Deborah Rice is a 27 y.o. female who presents for the following: spot check  Patient states she has spots located at the scattered throughout the upper body primarily at posterior neck and left axilla that she would like to have examined. Patient reports the areas have been there for 8 months. She reports the areas are at times bothersome and rates irritation/itching 6 out of 10 when it is moist. She states that the areas have spread. Patient reports she has not previously been treated for these areas. Patient denies Hx of bx. Patient denies family history of skin cancer(s).  The patient has spots, moles and lesions to be evaluated, some may be new or changing and the patient may have concern these could be cancer.   The following portions of the chart were reviewed this encounter and updated as appropriate: medications, allergies, medical history  Review of Systems:  No other skin or systemic complaints except as noted in HPI or Assessment and Plan.  Objective  Well appearing patient in no apparent distress; mood and affect are within normal limits.   A focused examination was performed of the following areas: upper body   Relevant exam findings are noted in the Assessment and Plan.                     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   Documentation: I have reviewed the above documentation for accuracy and completeness, and I agree with the above.   I, Shirron Marcha Solders, CMA, am acting as scribe for [employer], DO.   Langston Reusing, DO

## 2023-05-26 NOTE — Patient Instructions (Addendum)
Hello Deborah Rice,  Thank you for visiting [place] today. We appreciate your commitment to improving your skin health. Here is a summary of the key instructions from today's consultation:  - Medication: Start applying Clotrimazole Betamethasone cream to the affected area twice daily, once in the morning and once at night.   - Treatment Cycle: Use the cream for two weeks, followed by a two-week break applying only Aquaphor. Repeat this cycle as necessary.   - Follow-Up: We will re-evaluate your condition in two months. Please return for a follow-up appointment to ensure improvement.   - Monitoring: Should there be any changes or if the condition worsens, please contact us through MyChart.   - Pharmacy: Your prescription will be sent directly to your pharmacy. The cream is generic, ensuring availability should not be an issue.  We have documented the appearance of your elbows for comparison at your next visit. If you have any questions or concerns before then, please do not hesitate to reach out.  Warm regards,  Dr. Langston Reusing,  Dermatology Important Information  Due to recent changes in healthcare laws, you may see results of your pathology and/or laboratory studies on MyChart before the doctors have had a chance to review them. We understand that in some cases there may be results that are confusing or concerning to you. Please understand that not all results are received at the same time and often the doctors may need to interpret multiple results in order to provide you with the best plan of care or course of treatment. Therefore, we ask that you please give [place] 2 business days to thoroughly review all your results before contacting the office for clarification. Should we see a critical lab result, you will be contacted sooner.   If You Need Anything After Your Visit  If you have any questions or concerns for your doctor, please call our main line at [phone] If no one answers, please leave a  voicemail as directed and we will return your call as soon as possible. Messages left after 4 pm will be answered the following business day.   You may also send [place] a message via MyChart. We typically respond to MyChart messages within 1-2 business days.  For prescription refills, please ask your pharmacy to contact our office. Our fax number is [phone].  If you have an urgent issue when the clinic is closed that cannot wait until the next business day, you can page your doctor at the number below.    Please note that while we do our best to be available for urgent issues outside of office hours, we are not available 24/7.   If you have an urgent issue and are unable to reach [place], you may choose to seek medical care at your doctor's office, retail clinic, urgent care center, or emergency room.  If you have a medical emergency, please immediately call 911 or go to the emergency department. In the event of inclement weather, please call our main line at [phone] for an update on the status of any delays or closures.  Dermatology Medication Tips: Please keep the boxes that topical medications come in in order to help keep track of the instructions about where and how to use these. Pharmacies typically print the medication instructions only on the boxes and not directly on the medication tubes.   If your medication is too expensive, please contact our office at [phone] or send [place] a message through MyChart.   We are unable to  tell what your co-pay for medications will be in advance as this is different depending on your insurance coverage. However, we may be able to find a substitute medication at lower cost or fill out paperwork to get insurance to cover a needed medication.   If a prior authorization is required to get your medication covered by your insurance company, please allow [place] 1-2 business days to complete this process.  Drug prices often vary depending on where the  prescription is filled and some pharmacies may offer cheaper prices.  The website www.goodrx.com contains coupons for medications through different pharmacies. The prices here do not account for what the cost may be with help from insurance (it may be cheaper with your insurance), but the website can give you the price if you did not use any insurance.  - You can print the associated coupon and take it with your prescription to the pharmacy.  - You may also stop by our office during regular business hours and pick up a GoodRx coupon card.  - If you need your prescription sent electronically to a different pharmacy, notify our office through [facility] or by phone at [phone]

## 2023-05-27 MED ORDER — CLOTRIMAZOLE-BETAMETHASONE 1-0.05 % EX CREA: 1.0000 | TOPICAL_CREAM | Freq: Two times a day (BID) | CUTANEOUS | 0 refills | Status: DC

## 2023-06-07 DIAGNOSIS — F4381 Prolonged grief disorder: Secondary | ICD-10-CM | POA: Diagnosis not present

## 2023-06-07 DIAGNOSIS — F331 Major depressive disorder, recurrent, moderate: Secondary | ICD-10-CM | POA: Diagnosis not present

## 2023-06-08 ENCOUNTER — Other Ambulatory Visit (INDEPENDENT_AMBULATORY_CARE_PROVIDER_SITE_OTHER): Payer: Medicaid Other

## 2023-06-08 ENCOUNTER — Ambulatory Visit: Payer: Medicaid Other | Admitting: *Deleted

## 2023-06-08 VITALS — BP 122/71 | HR 71 | Wt 205.0 lb

## 2023-06-08 DIAGNOSIS — O3680X0 Pregnancy with inconclusive fetal viability, not applicable or unspecified: Secondary | ICD-10-CM

## 2023-06-08 DIAGNOSIS — Z3A01 Less than 8 weeks gestation of pregnancy: Secondary | ICD-10-CM

## 2023-06-08 DIAGNOSIS — Z3481 Encounter for supervision of other normal pregnancy, first trimester: Secondary | ICD-10-CM | POA: Diagnosis not present

## 2023-06-08 DIAGNOSIS — Z3480 Encounter for supervision of other normal pregnancy, unspecified trimester: Secondary | ICD-10-CM | POA: Insufficient documentation

## 2023-06-08 NOTE — Progress Notes (Signed)
New OB Intake  I explained I am completing New OB Intake today. We discussed EDD of 02/03/2024, by Ultrasound. Pt is G3P2002. I reviewed her allergies, medications and Medical/Surgical/OB history.    Patient Active Problem List   Diagnosis Date Noted   Supervision of other normal pregnancy, antepartum 06/08/2023   [redacted] weeks gestation of pregnancy 06/08/2023   Anxiety 04/06/2019   Depression 04/06/2019   Obesity affecting pregnancy 08/23/2018    Concerns addressed today  Patient informed that the ultrasound is considered a limited obstetric ultrasound and is not intended to be a complete ultrasound exam.  Patient also informed that the ultrasound is not being completed with the intent of assessing for fetal or placental anomalies or any pelvic abnormalities. Explained that the purpose of today's ultrasound is to assess for viability.  Patient acknowledges the purpose of the exam and the limitations of the study.     Delivery Plans Plans to deliver at [facility]. Discussed the nature of our practice with multiple providers including residents and students. Due to the size of the practice, the delivering provider may not be the same as those providing prenatal care.   MyChart/Babyscripts MyChart access verified. I explained pt will have some visits in office and some virtually.   Anatomy US Explained first scheduled [place] will be around 19 weeks.   Last Pap Diagnosis  Date Value Ref Range Status  05/27/2022   Final   - Negative for intraepithelial lesion or malignancy (NILM)    First visit review I reviewed new OB appt with patient. Explained pt will be seen by Dr Shawnie Pons at first visit. Discussed Avelina Laine genetic screening with patient , will get panorama at [facility]. Had some carrier testing done in prior pregnancy. Routine prenatal labs to be collected at [facility].    Scheryl Marten, RN 06/08/2023  4:11 PM

## 2023-06-09 ENCOUNTER — Ambulatory Visit: Payer: Medicaid Other | Admitting: Family Medicine

## 2023-06-21 DIAGNOSIS — F331 Major depressive disorder, recurrent, moderate: Secondary | ICD-10-CM | POA: Diagnosis not present

## 2023-06-21 DIAGNOSIS — F4381 Prolonged grief disorder: Secondary | ICD-10-CM | POA: Diagnosis not present

## 2023-07-07 ENCOUNTER — Other Ambulatory Visit (HOSPITAL_COMMUNITY)
Admission: RE | Admit: 2023-07-07 | Discharge: 2023-07-07 | Disposition: A | Discharge status: Home / Self Care | Payer: Medicaid Other | Source: Ambulatory Visit | Attending: Family Medicine | Admitting: Family Medicine

## 2023-07-07 ENCOUNTER — Ambulatory Visit (INDEPENDENT_AMBULATORY_CARE_PROVIDER_SITE_OTHER): Payer: Medicaid Other | Admitting: Family Medicine

## 2023-07-07 ENCOUNTER — Encounter: Payer: Self-pay | Admitting: Family Medicine

## 2023-07-07 VITALS — BP 129/84 | HR 88 | Wt 203.0 lb

## 2023-07-07 DIAGNOSIS — Z3A09 9 weeks gestation of pregnancy: Secondary | ICD-10-CM | POA: Diagnosis not present

## 2023-07-07 DIAGNOSIS — Z3480 Encounter for supervision of other normal pregnancy, unspecified trimester: Secondary | ICD-10-CM

## 2023-07-07 DIAGNOSIS — Z131 Encounter for screening for diabetes mellitus: Secondary | ICD-10-CM | POA: Diagnosis not present

## 2023-07-07 DIAGNOSIS — Z3481 Encounter for supervision of other normal pregnancy, first trimester: Secondary | ICD-10-CM

## 2023-07-07 DIAGNOSIS — Z1339 Encounter for screening examination for other mental health and behavioral disorders: Secondary | ICD-10-CM | POA: Diagnosis not present

## 2023-07-07 NOTE — Progress Notes (Signed)
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   Last pap: 05/27/2022  Genetic Screening: Yes   CC: Elevated B/P readings at work pt works as a Lawyer also notes chest pain that will last a few hrs. Reading are elevated even after sitting and charting.

## 2023-07-07 NOTE — Progress Notes (Signed)
Subjective:   Deborah Rice is a 27 y.o. G3P2002 at [redacted]w[redacted]d by early ultrasound being seen today for her first obstetrical visit.  Her obstetrical history is not significant. Pregnancy history fully reviewed.  Patient reports no complaints.  HISTORY: OB History  Gravida Para Term Preterm AB Living  3 2 2  0 0 2  SAB IAB Ectopic Multiple Live Births  0 0 0 0 2    # Outcome Date GA Lbr Len/2nd Weight Sex Type Anes PTL Lv  3 Current           2 Term 03/01/19 [redacted]w[redacted]d 05:37 / 00:03 7 lb 13.8 oz (3.565 kg) M Vag-Spont None  LIV     Name: Letarte,BOY Tamber     Apgar1: 9  Apgar5: 9  1 Term 07/28/15   7 lb (3.175 kg) M Vag-Spont  N LIV   Last pap smear was  05/27/2022 and was normal Past Medical History:  Diagnosis Date   Asthma    Chlamydia 12/02/2021   Low TSH level 09/10/2018   Normal 03/29/2019   Neg rpt at 28wks Normal fT4   Past Surgical History:  Procedure Laterality Date   DILATION AND EVACUATION N/A 03/11/2019   Procedure: DILATATION AND EVACUATION;  Surgeon: Catalina Antigua, MD;  Location: [facility];  Service: Gynecology;  Laterality: N/A;   INSERTION OF IMPLANON ROD  03/10/2022   REMOVAL OF IMPLANON ROD  03/10/2022   REMOVAL OF IMPLANON ROD  03/18/2022   WISDOM TOOTH EXTRACTION  2014   Family History  Problem Relation Age of Onset   Asthma Sister    Asthma Brother    Diabetes Maternal Grandmother    Social History   Tobacco Use   Smoking status: Never   Smokeless tobacco: Never  Vaping Use   Vaping status: Never Used  Substance Use Topics   Alcohol use: Not Currently    Alcohol/week: 1.0 standard drink of alcohol    Types: 1 Glasses of wine per week   Drug use: Not Currently   No Known Allergies Current Outpatient Medications on File Prior to Visit  Medication Sig Dispense Refill   clotrimazole-betamethasone (LOTRISONE) cream Apply 1 Application topically 2 (two) times daily. For 2 weeks then take an break for 2 weeks.  Repeat 2 weeks on, 2 weeks off until clear 30  g 0   No current facility-administered medications on file prior to visit.     Exam   Vitals:   07/07/23 1026  BP: 129/84  Pulse: 88  Weight: 203 lb (92.1 kg)      System: General: well-developed, well-nourished female in no acute distress   Skin: normal coloration and turgor, no rashes   Neurologic: oriented, normal, negative, normal mood   Extremities: normal strength, tone, and muscle mass, ROM of all joints is normal   HEENT PERRLA, extraocular movement intact and sclera clear, anicteric   Mouth/Teeth mucous membranes moist, pharynx normal without lesions and dental hygiene good   Neck supple and no masses   Cardiovascular: regular rate and rhythm   Respiratory:  no respiratory distress, normal breath sounds   Abdomen: soft, non-tender; bowel sounds normal; no masses,  no organomegaly     Assessment:   Pregnancy: [id number] Patient Active Problem List   Diagnosis Date Noted   Supervision of other normal pregnancy, antepartum 06/08/2023   [redacted] weeks gestation of pregnancy 06/08/2023   Anxiety 04/06/2019   Depression 04/06/2019   Obesity affecting pregnancy 08/23/2018  Plan:  1. Supervision of other normal pregnancy, antepartum New OB labs today - CBC/D/Plt+RPR+Rh+ABO+RubIgG... - Cervicovaginal ancillary only - Hemoglobin A1c - TSH Rfx on Abnormal to Free T4 - [place] MFM OB DETAIL +14 WK; Future - Culture, OB Urine   2. 9 weeks  Initial labs drawn. Continue prenatal vitamins. Genetic Screening discussed, NIPS: ordered. Ultrasound discussed; fetal anatomic survey: ordered. Problem list reviewed and updated.  Routine obstetric precautions reviewed. Return in 4 weeks (on 08/04/2023).

## 2023-07-08 ENCOUNTER — Encounter: Payer: Self-pay | Admitting: Family Medicine

## 2023-07-08 DIAGNOSIS — F4381 Prolonged grief disorder: Secondary | ICD-10-CM | POA: Diagnosis not present

## 2023-07-08 DIAGNOSIS — F331 Major depressive disorder, recurrent, moderate: Secondary | ICD-10-CM | POA: Diagnosis not present

## 2023-07-08 LAB — CBC/D/PLT+RPR+RH+ABO+RUBIGG...
Antibody Screen: NEGATIVE
Basophils Absolute: 0 10*3/uL (ref 0.0–0.2)
Basos: 0 %
EOS (ABSOLUTE): 0 10*3/uL (ref 0.0–0.4)
Eos: 0 %
HCV Ab: NONREACTIVE
HIV Screen 4th Generation wRfx: NONREACTIVE
Hematocrit: 37.9 % (ref 34.0–46.6)
Hemoglobin: 12.6 g/dL (ref 11.1–15.9)
Hepatitis B Surface Ag: NEGATIVE
Immature Grans (Abs): 0 10*3/uL (ref 0.0–0.1)
Immature Granulocytes: 0 %
Lymphocytes Absolute: 1.7 10*3/uL (ref 0.7–3.1)
Lymphs: 19 %
MCH: 28.2 pg (ref 26.6–33.0)
MCHC: 33.2 g/dL (ref 31.5–35.7)
MCV: 85 fL (ref 79–97)
Monocytes Absolute: 0.4 10*3/uL (ref 0.1–0.9)
Monocytes: 5 %
Neutrophils Absolute: 7.1 10*3/uL — ABNORMAL HIGH (ref 1.4–7.0)
Neutrophils: 76 %
Platelets: 271 10*3/uL (ref 150–450)
RBC: 4.47 x10E6/uL (ref 3.77–5.28)
RDW: 13.3 % (ref 11.7–15.4)
RPR Ser Ql: NONREACTIVE
Rh Factor: POSITIVE
Rubella Antibodies, IGG: 1.3 {index} (ref >0.99)
WBC: 9.4 10*3/uL (ref 3.4–10.8)

## 2023-07-08 LAB — HCV INTERPRETATION

## 2023-07-08 LAB — CERVICOVAGINAL ANCILLARY ONLY
Chlamydia: NEGATIVE
Comment: NEGATIVE
Comment: NORMAL
Neisseria Gonorrhea: NEGATIVE

## 2023-07-08 LAB — HEMOGLOBIN A1C
Est. average glucose Bld gHb Est-mCnc: 103 mg/dL
Hgb A1c MFr Bld: 5.2 % (ref 4.8–5.6)

## 2023-07-08 LAB — TSH RFX ON ABNORMAL TO FREE T4: TSH: 0.723 u[IU]/mL (ref 0.450–4.500)

## 2023-07-09 LAB — URINE CULTURE, OB REFLEX

## 2023-07-09 LAB — CULTURE, OB URINE

## 2023-07-12 ENCOUNTER — Other Ambulatory Visit: Payer: Medicaid Other | Admitting: Family Medicine

## 2023-07-12 DIAGNOSIS — Z3481 Encounter for supervision of other normal pregnancy, first trimester: Secondary | ICD-10-CM

## 2023-07-19 DIAGNOSIS — F331 Major depressive disorder, recurrent, moderate: Secondary | ICD-10-CM | POA: Diagnosis not present

## 2023-07-19 DIAGNOSIS — F4381 Prolonged grief disorder: Secondary | ICD-10-CM | POA: Diagnosis not present

## 2023-07-20 LAB — PANORAMA PRENATAL TEST FULL PANEL:PANORAMA TEST PLUS 5 ADDITIONAL MICRODELETIONS: FETAL FRACTION: 7.8

## 2023-07-28 NOTE — L&D Delivery Note (Signed)
 OB/GYN Faculty Practice Delivery Note  Deborah Rice is a 28 y.o. [id number] s/p SVD at [redacted]w[redacted]d. She was admitted for IOL for LGA.   ROM: 2h 24m with clear fluid GBS Status: Positive/-- (06/17 1354) Maximum Maternal Temperature: Temp (24hrs), Avg:98.2 F (36.8 C), Min:98.2 F (36.8 C), Max:98.2 F (36.8 C)   Labor Progress: Initial SVE: 1/50/-3. AROM, Cytotec , and IP Foley required. She then progressed to complete.   Delivery Date/Time: 01/27/24 0738 Delivery: Called to room and patient was involuntarily pushing. Head visible. Pushed in hands and knees position through 2 contractions. Head delivered OA. nuchal x1 present which was delivered through. Shoulder and body delivered in usual fashion. Infant with spontaneous cry, placed on mother's abdomen, dried and stimulated. Cord clamped x 2 after +1-minute delay, and cut by FOB. Cord gases not obtained. Cord blood drawn. Placenta delivered spontaneously with gentle cord traction. Fundus firm with massage and Pitocin . Labia, perineum, and vagina inspected with no lacerations. Mom and baby to postpartum. Baby Weight: pending  Placenta: 3 vessel, intact. Sent to L&D Complications: None Lacerations: None EBL: 500 mL Anesthesia: None  Infant: baby girl APGAR (1 MIN): 7  APGAR (5 MINS): 8  APGAR (10 MINS):    [name], MD [facility] Fellow, [facility] for [facility], [facility] 01/27/2024, 8:23 AM

## 2023-08-05 ENCOUNTER — Other Ambulatory Visit (HOSPITAL_COMMUNITY): Payer: Self-pay | Admitting: Family Medicine

## 2023-08-05 ENCOUNTER — Encounter (HOSPITAL_COMMUNITY): Payer: Self-pay | Admitting: Obstetrics and Gynecology

## 2023-08-05 ENCOUNTER — Ambulatory Visit: Payer: Medicaid Other | Admitting: Dermatology

## 2023-08-05 ENCOUNTER — Inpatient Hospital Stay (HOSPITAL_COMMUNITY)
Admission: AD | Admit: 2023-08-05 | Discharge: 2023-08-05 | Disposition: A | Discharge status: Home / Self Care | Payer: Medicaid Other | Attending: Obstetrics and Gynecology | Admitting: Obstetrics and Gynecology

## 2023-08-05 ENCOUNTER — Telehealth: Payer: Self-pay

## 2023-08-05 ENCOUNTER — Inpatient Hospital Stay (HOSPITAL_COMMUNITY): Payer: Medicaid Other

## 2023-08-05 DIAGNOSIS — O4691 Antepartum hemorrhage, unspecified, first trimester: Secondary | ICD-10-CM | POA: Diagnosis not present

## 2023-08-05 DIAGNOSIS — O4412 Placenta previa with hemorrhage, second trimester: Secondary | ICD-10-CM | POA: Insufficient documentation

## 2023-08-05 DIAGNOSIS — O26892 Other specified pregnancy related conditions, second trimester: Secondary | ICD-10-CM | POA: Insufficient documentation

## 2023-08-05 DIAGNOSIS — O4692 Antepartum hemorrhage, unspecified, second trimester: Secondary | ICD-10-CM | POA: Diagnosis not present

## 2023-08-05 DIAGNOSIS — Z3A14 14 weeks gestation of pregnancy: Secondary | ICD-10-CM | POA: Insufficient documentation

## 2023-08-05 DIAGNOSIS — O99512 Diseases of the respiratory system complicating pregnancy, second trimester: Secondary | ICD-10-CM | POA: Diagnosis not present

## 2023-08-05 DIAGNOSIS — O4690 Antepartum hemorrhage, unspecified, unspecified trimester: Secondary | ICD-10-CM

## 2023-08-05 DIAGNOSIS — O99612 Diseases of the digestive system complicating pregnancy, second trimester: Secondary | ICD-10-CM | POA: Diagnosis not present

## 2023-08-05 DIAGNOSIS — Z3480 Encounter for supervision of other normal pregnancy, unspecified trimester: Secondary | ICD-10-CM

## 2023-08-05 LAB — URINALYSIS, ROUTINE W REFLEX MICROSCOPIC
Bilirubin Urine: NEGATIVE
Glucose, UA: NEGATIVE mg/dL
Ketones, ur: NEGATIVE mg/dL
Leukocytes,Ua: NEGATIVE
Nitrite: NEGATIVE
Protein, ur: NEGATIVE mg/dL
Specific Gravity, Urine: 1.023 (ref 1.005–1.030)
pH: 6 (ref 5.0–8.0)

## 2023-08-05 LAB — WET PREP, GENITAL
Sperm: NONE SEEN
Trich, Wet Prep: NONE SEEN
WBC, Wet Prep HPF POC: >=10 — AB (ref <10)
Yeast Wet Prep HPF POC: NONE SEEN

## 2023-08-05 MED ORDER — METRONIDAZOLE 500 MG PO TABS: 500.0000 mg | ORAL_TABLET | Freq: Two times a day (BID) | ORAL | 0 refills | Status: AC

## 2023-08-05 NOTE — MAU Provider Note (Addendum)
 History     CSN: [id number]  Arrival date and time: 08/05/23 0858   None     Chief Complaint  Patient presents with   Vaginal Bleeding   Abdominal Pain   HPI Patient presenting for vaginal bleeding in pregnancy.  Reports that she was woke up this morning by trickling down her leg and it was dark red blood.  Now she is just noticing blood when she wipes in the bathroom.  She reports sexual activity on Monday but none since then.  Reports chronic issues with cramping but nothing worse than normal.  Reports chronic issues with constipation as well.  Denies any other symptoms.  OB History     Gravida  3   Para  2   Term  2   Preterm      AB      Living  2      SAB      IAB      Ectopic      Multiple  0   Live Births  2           Past Medical History:  Diagnosis Date   Asthma    Chlamydia 12/02/2021   Low TSH level 09/10/2018   Normal 03/29/2019   Neg rpt at 28wks Normal fT4    Past Surgical History:  Procedure Laterality Date   DILATION AND EVACUATION N/A 03/11/2019   Procedure: DILATATION AND EVACUATION;  Surgeon: Alger Gong, MD;  Location: [facility] OR;  Service: Gynecology;  Laterality: N/A;   INSERTION OF IMPLANON  ROD  03/10/2022   REMOVAL OF IMPLANON  ROD  03/10/2022   REMOVAL OF IMPLANON  ROD  03/18/2022   WISDOM TOOTH EXTRACTION  2014    Family History  Problem Relation Age of Onset   Asthma Sister    Asthma Brother    Diabetes Maternal Grandmother     Social History   Tobacco Use   Smoking status: Never   Smokeless tobacco: Never  Vaping Use   Vaping status: Never Used  Substance Use Topics   Alcohol use: Not Currently    Alcohol/week: 1.0 standard drink of alcohol    Types: 1 Glasses of wine per week   Drug use: Not Currently    Allergies: No Known Allergies  Medications Prior to Admission  Medication Sig Dispense Refill Last Dose/Taking   Prenatal Vit-Fe Fumarate-FA (PRENATAL MULTIVITAMIN) TABS tablet Take 1 tablet by mouth  daily at 12 noon.   08/04/2023   clotrimazole -betamethasone  (LOTRISONE ) cream Apply 1 Application topically 2 (two) times daily. For 2 weeks then take an break for 2 weeks.  Repeat 2 weeks on, 2 weeks off until clear 30 g 0     Review of Systems  Constitutional:  Negative for chills and fever.  HENT:  Negative for congestion and rhinorrhea.   Respiratory:  Negative for shortness of breath.   Cardiovascular:  Negative for chest pain.  Gastrointestinal:  Positive for constipation and vomiting. Negative for abdominal pain and diarrhea.  Genitourinary:  Positive for vaginal bleeding. Negative for vaginal discharge.  Neurological:  Negative for light-headedness and headaches.   Physical Exam   Blood pressure 126/77, pulse 73, temperature 98.7 F (37.1 C), temperature source Oral, resp. rate 14, height 5' 3.5 (1.613 m), weight 94.5 kg, last menstrual period 03/28/2023, SpO2 100%, unknown if currently breastfeeding.  Physical Exam Vitals reviewed.  Constitutional:      Appearance: She is well-developed.  HENT:     Head: Normocephalic.  Cardiovascular:  Rate and Rhythm: Normal rate.  Pulmonary:     Effort: Pulmonary effort is normal. No respiratory distress.  Abdominal:     General: There is no distension.     Palpations: Abdomen is soft.     Tenderness: There is no abdominal tenderness.  Genitourinary:    Vagina: Normal.     Cervix: Discharge and friability present.     Comments: Irritation of cervical os, small amount of old blood appreciated Skin:    General: Skin is warm.     Capillary Refill: Capillary refill takes less than 2 seconds.  Neurological:     General: No focal deficit present.     Mental Status: She is alert.  Psychiatric:        Mood and Affect: Mood normal.     MAU Course  Procedures  MDM Ultrasound Fetal heart tones   Assessment and Plan  Deborah Rice is a 28 yo G3P2 presenting for vaginal bleeding at 14 weeks.  Vaginal bleeding in  pregnancy Patient reported blood dripping down her leg this morning.  Blood was dark in color.  Reports intercourse on Monday.  Fetal heart tones appropriate today.  Ultrasound showing anterior placenta, closed cervix, no signs of placental abruption.  Speculum exam showing irritation of cervical os.  Bleeding likely related to recent intercourse.  Will send cultures.  Discussed cessation of intercourse until all symptoms resolved.  No further questions or concerns.  Discussed tricked return precautions.  Patient discharged home.  Deborah Rice V Deborah Rice 08/05/2023, 10:05 AM

## 2023-08-05 NOTE — Telephone Encounter (Signed)
 Pt called in to speak w/ nurse about bleeding. Instructed by physician to go to MAU for eval.

## 2023-08-05 NOTE — Discharge Instructions (Signed)
 It was a pleasure taking care of you today.  The ultrasound that we obtained looked good and I have no concerns from it.  The bleeding could have been from your intercourse.  Please continue to monitor the symptoms and if the bleeding worsens or you have any other concerns return for further evaluation.  I hope you have a great day!

## 2023-08-05 NOTE — MAU Note (Signed)
..  Deborah Rice is a 28 y.o. at [redacted]w[redacted]d here in MAU reporting: woke up around 0430 from a gush of blood that rushed down her leg. When she stood up it continued to run down her leg and onto the floor. Patient reports that now it has slowed down to spotting. Last intercourse was on Monday. Does report intermittent cramping that she rates a 1/10.   Pain score: 1 Vitals:   08/05/23 0911  BP: 126/77  Pulse: 73  Resp: 14  Temp: 98.7 F (37.1 C)  SpO2: 100%     FHT:150 Lab orders placed from triage: UA

## 2023-08-06 ENCOUNTER — Ambulatory Visit (INDEPENDENT_AMBULATORY_CARE_PROVIDER_SITE_OTHER): Payer: Medicaid Other | Admitting: Family Medicine

## 2023-08-06 VITALS — BP 112/73 | HR 75 | Wt 203.0 lb

## 2023-08-06 DIAGNOSIS — O99212 Obesity complicating pregnancy, second trimester: Secondary | ICD-10-CM

## 2023-08-06 DIAGNOSIS — E669 Obesity, unspecified: Secondary | ICD-10-CM

## 2023-08-06 DIAGNOSIS — Z3480 Encounter for supervision of other normal pregnancy, unspecified trimester: Secondary | ICD-10-CM

## 2023-08-06 DIAGNOSIS — Z3A14 14 weeks gestation of pregnancy: Secondary | ICD-10-CM

## 2023-08-06 LAB — GC/CHLAMYDIA PROBE AMP (~~LOC~~) NOT AT ARMC
Chlamydia: NEGATIVE
Comment: NEGATIVE
Comment: NORMAL
Neisseria Gonorrhea: NEGATIVE

## 2023-08-06 NOTE — Progress Notes (Signed)
   PRENATAL VISIT NOTE  Subjective:  Deborah Rice is a 28 y.o. G3P2002 at [redacted]w[redacted]d being seen today for ongoing prenatal care.  She is currently monitored for the following issues for this low-risk pregnancy and has Obesity affecting pregnancy; Anxiety; Depression; and Supervision of other normal pregnancy, antepartum on their problem list.  Patient reports no complaints.  Contractions: Not present. Vag. Bleeding: Small.  Movement: Present. Denies leaking of fluid.   The following portions of the patient's history were reviewed and updated as appropriate: allergies, current medications, past family history, past medical history, past social history, past surgical history and problem list.   Objective:   Vitals:   08/06/23 1111  BP: 112/73  Pulse: 75  Weight: 203 lb (92.1 kg)    Fetal Status: Fetal Heart Rate (bpm): 148   Movement: Present     General:  Alert, oriented and cooperative. Patient is in no acute distress.  Skin: Skin is warm and dry. No rash noted.   Cardiovascular: Normal heart rate noted  Respiratory: Normal respiratory effort, no problems with respiration noted  Abdomen: Soft, gravid, appropriate for gestational age.  Pain/Pressure: Present     Pelvic: Cervical exam deferred        Extremities: Normal range of motion.  Edema: None  Mental Status: Normal mood and affect. Normal behavior. Normal judgment and thought content.   Assessment and Plan:  Pregnancy: G3P2002 at [redacted]w[redacted]d 1. Supervision of other normal pregnancy, antepartum (Primary) Doing well Having pink spotting, FHR reassuring today and US  reassuring 1/9 Concerns today: only the pink spotting which is overall lessening since seen at the MAU  2. Obesity affecting pregnancy in second trimester, unspecified obesity type TWG=-2 lb (-0.907 kg)   Preterm labor symptoms and general obstetric precautions including but not limited to vaginal bleeding, contractions, leaking of fluid and fetal movement were reviewed in  detail with the patient. Please refer to After Visit Summary for other counseling recommendations.   Return in about 4 weeks (around 09/03/2023) for Routine prenatal care.  Future Appointments  Date Time Provider Department Center  09/03/2023 10:35 AM Herchel Gloris LABOR, MD [facility]  09/09/2023  2:15 PM [facility] NURSE [facility]  09/09/2023  2:30 PM [facility] US4 [facility]  09/23/2023  2:45 PM Alm Delon SAILOR, DO [facility] None    Suzen Maryan Masters, MD

## 2023-08-06 NOTE — Progress Notes (Signed)
 ROB   CC: still having bleeding when wiping now pink in color .

## 2023-08-11 ENCOUNTER — Ambulatory Visit (INDEPENDENT_AMBULATORY_CARE_PROVIDER_SITE_OTHER): Payer: Medicaid Other | Admitting: Family Medicine

## 2023-08-11 ENCOUNTER — Other Ambulatory Visit (HOSPITAL_COMMUNITY)
Admission: RE | Admit: 2023-08-11 | Discharge: 2023-08-11 | Disposition: A | Discharge status: Home / Self Care | Payer: Medicaid Other | Source: Ambulatory Visit | Attending: Family Medicine | Admitting: Family Medicine

## 2023-08-11 VITALS — BP 130/81 | HR 80 | Wt 206.0 lb

## 2023-08-11 DIAGNOSIS — O4690 Antepartum hemorrhage, unspecified, unspecified trimester: Secondary | ICD-10-CM | POA: Insufficient documentation

## 2023-08-11 DIAGNOSIS — Z3A14 14 weeks gestation of pregnancy: Secondary | ICD-10-CM

## 2023-08-11 DIAGNOSIS — O4692 Antepartum hemorrhage, unspecified, second trimester: Secondary | ICD-10-CM

## 2023-08-11 DIAGNOSIS — Z3480 Encounter for supervision of other normal pregnancy, unspecified trimester: Secondary | ICD-10-CM

## 2023-08-11 NOTE — Progress Notes (Signed)
   PRENATAL VISIT NOTE  Subjective:  Deborah Rice is a 28 y.o. G3P2002 at [redacted]w[redacted]d being seen today for ongoing prenatal care.  She is currently monitored for the following issues for this low-risk pregnancy and has Obesity affecting pregnancy; Anxiety; Depression; and Supervision of other normal pregnancy, antepartum on their problem list.  Patient reports bleeding.  Contractions: Not present. Vag. Bleeding: None.  Movement: Present. Denies leaking of fluid.   The following portions of the patient's history were reviewed and updated as appropriate: allergies, current medications, past family history, past medical history, past social history, past surgical history and problem list.   Objective:   Vitals:   08/11/23 1150  BP: 130/81  Pulse: 80  Weight: 206 lb (93.4 kg)    Fetal Status: Fetal Heart Rate (bpm): 148   Movement: Present     General:  Alert, oriented and cooperative. Patient is in no acute distress.  Skin: Skin is warm and dry. No rash noted.   Cardiovascular: Normal heart rate noted  Respiratory: Normal respiratory effort, no problems with respiration noted  Abdomen: Soft, gravid, appropriate for gestational age.  Pain/Pressure: Present     Pelvic: Cervical exam performed in the presence of a chaperone Dilation: Closed Effacement (%): Thick Station: Ballotable no blood noted, wet prep collected  Extremities: Normal range of motion.  Edema: None  Mental Status: Normal mood and affect. Normal behavior. Normal judgment and thought content.   Assessment and Plan:  Pregnancy: G3P2002 at [redacted]w[redacted]d 1. Vaginal bleeding during pregnancy, antepartum (Primary) Discussed [facility] and risks of PTL/SAB. No bleeding Pelvic rest If still bleeding and cannot work, will need to know. - Cervicovaginal ancillary only( [place])  2. [redacted] weeks gestation of pregnancy   3. Supervision of other normal pregnancy, antepartum   General obstetric precautions including but not limited to vaginal  bleeding, contractions, leaking of fluid and fetal movement were reviewed in detail with the patient. Please refer to After Visit Summary for other counseling recommendations.   No follow-ups on file.  Future Appointments  Date Time Provider Department Center  09/03/2023 10:35 AM Julianne Octave, MD [facility]  09/09/2023  2:15 PM [facility] NURSE [facility]  09/09/2023  2:30 PM [facility] US4 [facility]  09/23/2023  2:45 PM Dellar Fenton, DO [facility] None    Granville Layer, MD

## 2023-08-11 NOTE — Progress Notes (Signed)
 ROB   CC: vaginal bleeding more when wiping pt states blood is pink in color also having pain/cramping

## 2023-08-13 ENCOUNTER — Encounter: Payer: Self-pay | Admitting: Family Medicine

## 2023-08-13 LAB — CERVICOVAGINAL ANCILLARY ONLY
Bacterial Vaginitis (gardnerella): POSITIVE — AB
Candida Glabrata: NEGATIVE
Candida Vaginitis: NEGATIVE
Comment: NEGATIVE
Comment: NEGATIVE
Comment: NEGATIVE

## 2023-09-02 DIAGNOSIS — F4381 Prolonged grief disorder: Secondary | ICD-10-CM | POA: Diagnosis not present

## 2023-09-02 DIAGNOSIS — F331 Major depressive disorder, recurrent, moderate: Secondary | ICD-10-CM | POA: Diagnosis not present

## 2023-09-03 ENCOUNTER — Ambulatory Visit: Payer: Medicaid Other | Admitting: Obstetrics & Gynecology

## 2023-09-03 ENCOUNTER — Encounter: Payer: Self-pay | Admitting: Obstetrics & Gynecology

## 2023-09-03 VITALS — BP 120/81 | HR 82 | Wt 209.0 lb

## 2023-09-03 DIAGNOSIS — Z3A18 18 weeks gestation of pregnancy: Secondary | ICD-10-CM | POA: Diagnosis not present

## 2023-09-03 DIAGNOSIS — Z3482 Encounter for supervision of other normal pregnancy, second trimester: Secondary | ICD-10-CM | POA: Diagnosis not present

## 2023-09-03 DIAGNOSIS — Z3480 Encounter for supervision of other normal pregnancy, unspecified trimester: Secondary | ICD-10-CM

## 2023-09-03 NOTE — Progress Notes (Deleted)
 ROB

## 2023-09-03 NOTE — Progress Notes (Signed)
   PRENATAL VISIT NOTE  Subjective:  Deborah Rice is a 28 y.o. G3P2002 at [redacted]w[redacted]d being seen today for ongoing prenatal care.  She is currently monitored for the following issues for this low-risk pregnancy and has Obesity affecting pregnancy and Supervision of other normal pregnancy, antepartum on their problem list.  Patient reports no complaints.  Contractions: Not present. Vag. Bleeding: None.  Movement: Absent. Denies leaking of fluid.   The following portions of the patient's history were reviewed and updated as appropriate: allergies, current medications, past family history, past medical history, past social history, past surgical history and problem list.   Objective:   Vitals:   09/03/23 1035  BP: 120/81  Pulse: 82  Weight: 209 lb (94.8 kg)    Fetal Status: Fetal Heart Rate (bpm): 150   Movement: Absent     General:  Alert, oriented and cooperative. Patient is in no acute distress.  Skin: Skin is warm and dry. No rash noted.   Cardiovascular: Normal heart rate noted  Respiratory: Normal respiratory effort, no problems with respiration noted  Abdomen: Soft, gravid, appropriate for gestational age.  Pain/Pressure: Absent     Pelvic: Cervical exam deferred        Extremities: Normal range of motion.  Edema: None  Mental Status: Normal mood and affect. Normal behavior. Normal judgment and thought content.   Assessment and Plan:  Pregnancy: G3P2002 at [redacted]w[redacted]d 1. [redacted] weeks gestation of pregnancy 2. Supervision of other normal pregnancy, antepartum (Primary) LR NIPS.  AFP being done today, will follow up results and manage accordingly. Anatomy scan already scheduled. - AFP, Serum, Open Spina Bifida No other complaints or concerns.  Routine obstetric precautions reviewed.  Please refer to After Visit Summary for other counseling recommendations.   Return in about 4 weeks (around 10/01/2023) for OFFICE OB VISIT (MD only).  Future Appointments  Date Time Provider Department  Center  09/06/2023  9:10 AM Jennelle Riis, MD [facility]  09/09/2023  2:15 PM [facility] NURSE [facility]  09/09/2023  2:30 PM [facility] US4 [facility]  09/23/2023  2:45 PM Alm Delon SAILOR, DO [facility] None    Gloris Hugger, MD

## 2023-09-05 LAB — AFP, SERUM, OPEN SPINA BIFIDA
AFP MoM: 1.08
AFP Value: 42.1 ng/mL
Gest. Age on Collection Date: 18.1 wk
Maternal Age At EDD: 28.8 a
OSBR Risk 1 IN: 10000
Test Results:: NEGATIVE
Weight: 209 [lb_av]

## 2023-09-06 ENCOUNTER — Encounter: Payer: Self-pay | Admitting: Obstetrics & Gynecology

## 2023-09-06 ENCOUNTER — Ambulatory Visit (INDEPENDENT_AMBULATORY_CARE_PROVIDER_SITE_OTHER): Payer: Medicaid Other | Admitting: Student

## 2023-09-06 DIAGNOSIS — Z0184 Encounter for antibody response examination: Secondary | ICD-10-CM | POA: Diagnosis not present

## 2023-09-06 DIAGNOSIS — Z0000 Encounter for general adult medical examination without abnormal findings: Secondary | ICD-10-CM | POA: Insufficient documentation

## 2023-09-06 NOTE — Patient Instructions (Signed)
 It was great to see you! Thank you for allowing me to participate in your care!  I recommend that you always bring your medications to each appointment as this makes it easy to ensure we are on the correct medications and helps us  not miss when refills are needed.  Our plans for today:  - Check Up  Checking Varicella Immunity   - TB testing PPD testing, you will need to come back to have your result read   We are checking some labs today, I will call you if they are abnormal will send you a MyChart message or a letter if they are normal.  If you do not hear about your labs in the next 2 weeks please let us  know.  Take care and seek immediate care sooner if you develop any concerns.   Dr. Wilhemena Harbour, MD [facility]

## 2023-09-06 NOTE — Assessment & Plan Note (Signed)
 Patient comes in for annual physical exam, for nursing school.  Patient has no complaints today.  Patient requesting varicella antibody titer, as she is pregnant her MMR antibody titers have been obtained.  Patient reports pregnancy going well, no complaints.  Patient already screened for HIV, syphilis.  Plan to have gonorrhea chlamydia check at 36 weeks.  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 - PPD - Varicella titer

## 2023-09-06 NOTE — Progress Notes (Signed)
    SUBJECTIVE:   Chief compliant/HPI: annual examination  Deborah Rice is a 28 y.o. who presents today for an annual exam.   Updated history tabs and problem list .   OBJECTIVE:   LMP 03/28/2023   Physical Exam Constitutional:      General: She is not in acute distress.    Appearance: Normal appearance. She is not ill-appearing.  Cardiovascular:     Rate and Rhythm: Normal rate and regular rhythm.     Pulses: Normal pulses.     Heart sounds: No murmur heard.    No friction rub. No gallop.  Pulmonary:     Effort: Pulmonary effort is normal. No respiratory distress.     Breath sounds: Normal breath sounds. No stridor. No wheezing, rhonchi or rales.  Chest:     Chest wall: No tenderness.  Abdominal:     General: Bowel sounds are normal. There is no distension.     Palpations: Abdomen is soft. There is no mass.     Tenderness: There is no abdominal tenderness. There is no guarding or rebound.     Hernia: No hernia is present.  Neurological:     Mental Status: She is alert.  Psychiatric:        Mood and Affect: Mood normal.        Behavior: Behavior normal.      ASSESSMENT/PLAN:   Annual physical exam Patient comes in for annual physical exam, for nursing school.  Patient has no complaints today.  Patient requesting varicella antibody titer, as she is pregnant her MMR antibody titers have been obtained.  Patient reports pregnancy going well, no complaints.  Patient already screened for HIV, syphilis.  Plan to have gonorrhea chlamydia check at 36 weeks.  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 - PPD - Varicella titer    Annual Examination  See AVS for age appropriate recommendations.   PHQ score not completed but no concern for depression. Blood pressure reviewed and at goal.  Asked about intimate partner violence and patient reports no concerns for abuse.  The patient currently is pregnant. Taking a PNV daily.    Considered the  following items based upon USPSTF recommendations: HIV testing: discussed and Neg 07/07/23 Hepatitis C: discussed Neg 07/07/23 Hepatitis B: discussed Neg 07/07/23 Syphilis if at high risk: discussed and Neg 07/07/23 GC/CT  will test for at 36 wks Lipid panel (nonfasting or fasting) discussed based upon AHA recommendations and not ordered.  Consider repeat every 4-6 years.  Reviewed risk factors for latent tuberculosis and ordered, patient needing PPD for nursing school.  Discussed family history, BRCA testing not indicated. Cervical cancer screening: prior Pap reviewed, repeat due in 2026  Follow up in 1  year or sooner if indicated.    Wilhemena Harbour, MD [facility]

## 2023-09-07 LAB — VARICELLA ZOSTER ANTIBODY, IGG: Varicella zoster IgG: NONREACTIVE

## 2023-09-09 ENCOUNTER — Telehealth: Payer: Self-pay | Admitting: Student

## 2023-09-09 ENCOUNTER — Other Ambulatory Visit: Payer: Self-pay

## 2023-09-09 ENCOUNTER — Ambulatory Visit: Payer: Medicaid Other

## 2023-09-09 ENCOUNTER — Other Ambulatory Visit: Payer: Self-pay | Admitting: *Deleted

## 2023-09-09 ENCOUNTER — Encounter: Payer: Self-pay | Admitting: Student

## 2023-09-09 ENCOUNTER — Ambulatory Visit: Payer: Medicaid Other | Admitting: Obstetrics

## 2023-09-09 ENCOUNTER — Ambulatory Visit: Payer: Medicaid Other | Attending: Family Medicine

## 2023-09-09 VITALS — BP 125/65

## 2023-09-09 DIAGNOSIS — E669 Obesity, unspecified: Secondary | ICD-10-CM

## 2023-09-09 DIAGNOSIS — O99212 Obesity complicating pregnancy, second trimester: Secondary | ICD-10-CM

## 2023-09-09 DIAGNOSIS — Z3A19 19 weeks gestation of pregnancy: Secondary | ICD-10-CM

## 2023-09-09 DIAGNOSIS — Z3480 Encounter for supervision of other normal pregnancy, unspecified trimester: Secondary | ICD-10-CM

## 2023-09-09 DIAGNOSIS — Z363 Encounter for antenatal screening for malformations: Secondary | ICD-10-CM | POA: Diagnosis not present

## 2023-09-09 NOTE — Progress Notes (Signed)
MFM Consult Note  Deborah Rice is currently at 19 weeks and 0 days.  She was seen due to maternal obesity with a BMI of 35.  She denies any significant past medical history and denies any problems in her current pregnancy.    She had a cell free DNA test earlier in her pregnancy which indicated a low risk for trisomy 67, 30, and 13. A female fetus is predicted.   She was informed that the fetal growth and amniotic fluid level were appropriate for her gestational age.   There were no obvious fetal anomalies noted on today's ultrasound exam.  The limitations of ultrasound in the detection of all anomalies was discussed.  Due to maternal obesity, she should be screened for gestational diabetes at between 24 to 28 weeks.    A follow-up growth scan was scheduled in the third trimester (at 31 to 32 weeks).    The patient stated that all of her questions were answered today.  A total of 30 minutes was spent counseling and coordinating the care for this patient.  Greater than 50% of the time was spent in direct face-to-face contact.

## 2023-09-09 NOTE — Telephone Encounter (Signed)
Patient returns call to nurse line.   Patient advised on the need for Varicella Vaccine after delivery.   Patient was appreciative of information.

## 2023-09-09 NOTE — Telephone Encounter (Signed)
Patient calls back.   She is requesting a note for school stating she is ineligible to receive vaccine at this time due to pregnancy.   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   Will froward to PCP.

## 2023-09-09 NOTE — Telephone Encounter (Signed)
Called patient and asked her to call clinic back and ask to speak to RN on staff about message.   Patient's varicella testing demonstrated that she is non-immune to varicella and will need this vaccine. As patient is pregnant, she cannot get the vaccine until after she delivers,  because the vaccine is a live vaccine and could harm baby.   Pt should call clinic and ask to set up appointment for Varicella vaccine, after deliver.

## 2023-09-16 ENCOUNTER — Encounter: Payer: Self-pay | Admitting: Student

## 2023-09-16 DIAGNOSIS — F331 Major depressive disorder, recurrent, moderate: Secondary | ICD-10-CM | POA: Diagnosis not present

## 2023-09-16 DIAGNOSIS — F4381 Prolonged grief disorder: Secondary | ICD-10-CM | POA: Diagnosis not present

## 2023-09-22 ENCOUNTER — Ambulatory Visit (INDEPENDENT_AMBULATORY_CARE_PROVIDER_SITE_OTHER): Payer: Medicaid Other

## 2023-09-22 DIAGNOSIS — Z111 Encounter for screening for respiratory tuberculosis: Secondary | ICD-10-CM | POA: Diagnosis not present

## 2023-09-22 NOTE — Progress Notes (Signed)
 Tuberculin skin test applied to left ventral forearm.  Patient to return on 2/28.

## 2023-09-23 ENCOUNTER — Encounter: Payer: Self-pay | Admitting: Dermatology

## 2023-09-23 ENCOUNTER — Ambulatory Visit: Payer: Medicaid Other | Admitting: Dermatology

## 2023-09-23 VITALS — BP 144/99

## 2023-09-23 DIAGNOSIS — L309 Dermatitis, unspecified: Secondary | ICD-10-CM | POA: Diagnosis not present

## 2023-09-23 DIAGNOSIS — L83 Acanthosis nigricans: Secondary | ICD-10-CM

## 2023-09-23 MED ORDER — SAFETY SEAL MISCELLANEOUS MISC: 1 refills | Status: DC

## 2023-09-23 NOTE — Patient Instructions (Addendum)
 Hello Deborah Rice,  Thank you for visiting my office today. Here is a summary of the key instructions and recommendations from today's consultation:  Clobetasol Cream: Continue using the prescribed cream for any flare-ups of your skin condition. Applying it early may help reduce the healing time.  Dietary Adjustments: Monitor and manage your sugar and carbohydrate intake. This is important for addressing the insulin resistance indicated by the acanthosis nigricans under your arms.  Glycolic Acid Toner: Begin using glycolic acid toner by [employer] every other night to help exfoliate the skin. Make sure to wash it off in the morning. On alternate nights, apply the prescribed lightening cream containing tranexamic acid, kojic acid, vitamin C, and resveratrol.  Follow-Up Appointment: Schedule a follow-up appointment in 6 months to assess progress. During this visit, we can take pictures to track the improvements in your underarm area.  For your convenience, I have included a picture of the glycolic acid toner for your reference, which is available at [facility].  I look forward to our next meeting in six months to continue monitoring your progress. Should you have any questions or concerns before then, please feel free to reach out.  Warm regards,  Dr. Langston [facility]      Important Information  Due to recent changes in healthcare laws, you may see results of your pathology and/or laboratory studies on MyChart before the doctors have had a chance to review them. We understand that in some cases there may be results that are confusing or concerning to you. Please understand that not all results are received at the same time and often the doctors may need to interpret multiple results in order to provide you with the best plan of care or course of treatment. Therefore, we ask that you please give [place] 2 business days to thoroughly review all your results before contacting the office for  clarification. Should we see a critical lab result, you will be contacted sooner.   If You Need Anything After Your Visit  If you have any questions or concerns for your doctor, please call our main line at [phone] If no one answers, please leave a voicemail as directed and we will return your call as soon as possible. Messages left after 4 pm will be answered the following business day.   You may also send [place] a message via MyChart. We typically respond to MyChart messages within 1-2 business days.  For prescription refills, please ask your pharmacy to contact our office. Our fax number is [phone].  If you have an urgent issue when the clinic is closed that cannot wait until the next business day, you can page your doctor at the number below.    Please note that while we do our best to be available for urgent issues outside of office hours, we are not available 24/7.   If you have an urgent issue and are unable to reach [place], you may choose to seek medical care at your doctor's office, retail clinic, urgent care center, or emergency room.  If you have a medical emergency, please immediately call 911 or go to the emergency department. In the event of inclement weather, please call our main line at [phone] for an update on the status of any delays or closures.  Dermatology Medication Tips: Please keep the boxes that topical medications come in in order to help keep track of the instructions about where and how to use these. Pharmacies typically print the medication instructions only on the  boxes and not directly on the medication tubes.   If your medication is too expensive, please contact our office at [phone] or send [place] a message through MyChart.   We are unable to tell what your co-pay for medications will be in advance as this is different depending on your insurance coverage. However, we may be able to find a substitute medication at lower cost or fill out paperwork to  get insurance to cover a needed medication.   If a prior authorization is required to get your medication covered by your insurance company, please allow [place] 1-2 business days to complete this process.  Drug prices often vary depending on where the prescription is filled and some pharmacies may offer cheaper prices.  The website www.goodrx.com contains coupons for medications through different pharmacies. The prices here do not account for what the cost may be with help from insurance (it may be cheaper with your insurance), but the website can give you the price if you did not use any insurance.  - You can print the associated coupon and take it with your prescription to the pharmacy.  - You may also stop by our office during regular business hours and pick up a GoodRx coupon card.  - If you need your prescription sent electronically to a different pharmacy, notify our office through [facility] or by phone at [phone]

## 2023-09-23 NOTE — Progress Notes (Signed)
   Follow-Up Visit   Subjective  Deborah Rice is a 28 y.o. female who presents for the following: Dermatitis  Patient present today for follow up visit. Patient was last evaluated on 05/26/23. At this visit patient was prescribed Clotrimazole- betamethasone. Patient reports sxs are better. Patient denies medication changes.  The following portions of the chart were reviewed this encounter and updated as appropriate: medications, allergies, medical history  Review of Systems:  No other skin or systemic complaints except as noted in HPI or Assessment and Plan.  Objective  Well appearing patient in no apparent distress; mood and affect are within normal limits.   A focused examination was performed of the following areas: neck   Relevant exam findings are noted in the Assessment and Plan.     Assessment & Plan   Dermatitis (possible psoriasis vs tinea)  Exam: Much improved pink papule coalescing to plaques  Plan: Continue using clobetasol for psoriasis on the back of the neck as needed for flares.    Acanthosis Nigricans Assessment: Patient exhibits residual discoloration under the arms, identified as acanthosis nigricans, a condition linked to insulin resistance. Notable increase in hemoglobin A1c level from 4.9 to 5.2 over the past 5 years, indicating early signs of insulin resistance.  Plan:   Use glycolic acid toner by [employer] every other night for gentle exfoliation.   Apply a prescription lightening cream with tranexamic acid, kojic acid, vitamin C, and resveratrol on alternate nights.   Follow up in 6 months with photos to track progress.  DERMATITIS   ACANTHOSIS NIGRICANS    No follow-ups on file.    Documentation: I have reviewed the above documentation for accuracy and completeness, and I agree with the above.  I, Shirron Marcha Solders, CMA, am acting as scribe for [employer], DO.   Langston Reusing, DO

## 2023-09-24 ENCOUNTER — Telehealth: Payer: Self-pay

## 2023-09-24 ENCOUNTER — Ambulatory Visit: Payer: Medicaid Other

## 2023-09-24 DIAGNOSIS — Z111 Encounter for screening for respiratory tuberculosis: Secondary | ICD-10-CM

## 2023-09-24 LAB — TB SKIN TEST
Induration: 0 mm
TB Skin Test: NEGATIVE

## 2023-09-24 NOTE — Progress Notes (Signed)
 Patient is here for a PPD read.  It was placed on 09/22/2023 in the left forearm @ 0945.    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     Veronda Prude, RN

## 2023-09-24 NOTE — Telephone Encounter (Signed)
 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   Physical completed on 09/06/23.  Form needs verification of Hep B immunity. Labs drawn on 07/07/2023 does not provide immunity titers. Confirmed with Molly Maduro that patient will need lab visit for Hep B quantitative immunity labs.   Will forward to PCP for order placement.   Form in provider box for completion.   Veronda Prude, RN

## 2023-09-28 ENCOUNTER — Other Ambulatory Visit: Payer: Self-pay | Admitting: Student

## 2023-09-28 DIAGNOSIS — Z1159 Encounter for screening for other viral diseases: Secondary | ICD-10-CM

## 2023-09-28 NOTE — Telephone Encounter (Signed)
 Order placed for quantative hep B immunity. Please let me know if it's the wrong one.

## 2023-09-28 NOTE — Progress Notes (Signed)
 Placing order for quantative hep b titre, per what's need for patients paperwork

## 2023-09-29 ENCOUNTER — Ambulatory Visit: Payer: Medicaid Other

## 2023-09-29 DIAGNOSIS — Z111 Encounter for screening for respiratory tuberculosis: Secondary | ICD-10-CM | POA: Diagnosis not present

## 2023-09-29 DIAGNOSIS — Z1159 Encounter for screening for other viral diseases: Secondary | ICD-10-CM

## 2023-09-29 NOTE — Progress Notes (Signed)
 Patient is here for a PPD placement.  PPD placed in right forearm @ 0915 am.  Patient will return 10/01/2023 to have PPD read.   Assisted patient to lab for Hep B immunity screening lab work.   Veronda Prude, RN

## 2023-09-29 NOTE — Telephone Encounter (Signed)
 Spoke with Molly Maduro. Order is correct and patient received labs at nursing visit this AM.   Veronda Prude, RN

## 2023-09-30 DIAGNOSIS — F331 Major depressive disorder, recurrent, moderate: Secondary | ICD-10-CM | POA: Diagnosis not present

## 2023-09-30 DIAGNOSIS — F4381 Prolonged grief disorder: Secondary | ICD-10-CM | POA: Diagnosis not present

## 2023-09-30 LAB — HEPATITIS B SURFACE ANTIBODY, QUANTITATIVE: Hepatitis B Surf Ab Quant: 323 m[IU]/mL

## 2023-09-30 NOTE — Progress Notes (Signed)
   PRENATAL VISIT NOTE  Subjective:  Deborah Rice is a 28 y.o. G3P2002 at [redacted]w[redacted]d being seen today for ongoing prenatal care.  She is currently monitored for the following issues for this low-risk pregnancy and has Obesity affecting pregnancy; Supervision of other normal pregnancy, antepartum; and Annual physical exam on their problem list.  Patient reports  thirsty at night time, drinking lots of water .  Contractions: Not present. Vag. Bleeding: None.  Movement: Present. Denies leaking of fluid.   The following portions of the patient's history were reviewed and updated as appropriate: allergies, current medications, past family history, past medical history, past social history, past surgical history and problem list.   Objective:   Vitals:   10/01/23 1118  BP: 126/87  Pulse: 80  Weight: 210 lb (95.3 kg)    Fetal Status: Fetal Heart Rate (bpm): 143 Fundal Height: 22 cm Movement: Present     General:  Alert, oriented and cooperative. Patient is in no acute distress.  Skin: Skin is warm and dry. No rash noted.   Cardiovascular: Normal heart rate noted  Respiratory: Normal respiratory effort, no problems with respiration noted  Abdomen: Soft, gravid, appropriate for gestational age.  Pain/Pressure: Present     Pelvic: Cervical exam deferred        Extremities: Normal range of motion.  Edema: None  Mental Status: Normal mood and affect. Normal behavior. Normal judgment and thought content.   Assessment and Plan:  Pregnancy: G3P2002 at [redacted]w[redacted]d 1. Supervision of other normal pregnancy, antepartum (Primary) Thristy at night, last HA1C 5.2%-- ordered labs today to assure glucose is WNL FH appropriate Feeling movement Up to date with care   2. Obesity affecting pregnancy in second trimester, unspecified obesity type TWG=5 lb (2.268 kg) which is at goal  Preterm labor symptoms and general obstetric precautions including but not limited to vaginal bleeding, contractions, leaking of  fluid and fetal movement were reviewed in detail with the patient. Please refer to After Visit Summary for other counseling recommendations.   Return in about 4 weeks (around 10/29/2023) for Routine prenatal care.  Future Appointments  Date Time Provider Department Center  10/29/2023  8:35 AM Anyanwu, Jethro Bastos, MD [facility]  11/19/2023  8:15 AM [facility] LAB [facility]  11/19/2023  8:35 AM Federico Flake, MD [facility]  12/02/2023  8:30 AM [facility]  03/28/2024  4:15 PM Terri Piedra, DO [facility] None    Federico Flake, MD

## 2023-10-01 ENCOUNTER — Ambulatory Visit

## 2023-10-01 ENCOUNTER — Ambulatory Visit: Payer: Medicaid Other | Admitting: Family Medicine

## 2023-10-01 VITALS — BP 126/87 | HR 80 | Wt 210.0 lb

## 2023-10-01 DIAGNOSIS — Z3480 Encounter for supervision of other normal pregnancy, unspecified trimester: Secondary | ICD-10-CM | POA: Diagnosis not present

## 2023-10-01 DIAGNOSIS — R631 Polydipsia: Secondary | ICD-10-CM

## 2023-10-01 DIAGNOSIS — Z111 Encounter for screening for respiratory tuberculosis: Secondary | ICD-10-CM

## 2023-10-01 DIAGNOSIS — O99212 Obesity complicating pregnancy, second trimester: Secondary | ICD-10-CM

## 2023-10-01 DIAGNOSIS — Z3A22 22 weeks gestation of pregnancy: Secondary | ICD-10-CM | POA: Diagnosis not present

## 2023-10-01 LAB — TB SKIN TEST
Induration: 0 mm
TB Skin Test: NEGATIVE

## 2023-10-01 NOTE — Progress Notes (Signed)
 Patient is here for a PPD read.  It was placed on 09/29/2023 in the right forearm @ 0915 am.    PPD RESULTS:  Result: negative Induration: 0 mm  Letter created and given to patient for documentation purposes.  School form signed by Dr. Manson Passey. Copy made and placed in batch scanning.   Veronda Prude, RN

## 2023-10-01 NOTE — Progress Notes (Signed)
 ROB: really thirsty worse at night

## 2023-10-01 NOTE — Telephone Encounter (Signed)
 Dr. Manson Passey signed off on form during nurse visit for PPD read.   Copy made and placed in batch scanning. Original provided to patient.   Veronda Prude, RN

## 2023-10-02 LAB — BASIC METABOLIC PANEL
BUN/Creatinine Ratio: 12 (ref 9–23)
BUN: 8 mg/dL (ref 6–20)
CO2: 21 mmol/L (ref 20–29)
Calcium: 9.3 mg/dL (ref 8.7–10.2)
Chloride: 103 mmol/L (ref 96–106)
Creatinine, Ser: 0.68 mg/dL (ref 0.57–1.00)
Glucose: 83 mg/dL (ref 70–99)
Potassium: 4 mmol/L (ref 3.5–5.2)
Sodium: 137 mmol/L (ref 134–144)
eGFR: 122 mL/min/{1.73_m2} (ref >59)

## 2023-10-02 LAB — URINALYSIS
Bilirubin, UA: NEGATIVE
Glucose, UA: NEGATIVE
Leukocytes,UA: NEGATIVE
Nitrite, UA: NEGATIVE
RBC, UA: NEGATIVE
Specific Gravity, UA: >=1.03 — AB (ref 1.005–1.030)
Urobilinogen, Ur: 1 mg/dL (ref 0.2–1.0)
pH, UA: 6.5 (ref 5.0–7.5)

## 2023-10-02 LAB — HEMOGLOBIN A1C
Est. average glucose Bld gHb Est-mCnc: 91 mg/dL
Hgb A1c MFr Bld: 4.8 % (ref 4.8–5.6)

## 2023-10-06 NOTE — Progress Notes (Signed)
 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

## 2023-10-11 ENCOUNTER — Telehealth: Payer: Self-pay | Admitting: Student

## 2023-10-11 NOTE — Telephone Encounter (Signed)
 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   I informed patient that no one had told me, and I was calling to check.   Patient was appreciative.

## 2023-10-14 DIAGNOSIS — F331 Major depressive disorder, recurrent, moderate: Secondary | ICD-10-CM | POA: Diagnosis not present

## 2023-10-14 DIAGNOSIS — F4381 Prolonged grief disorder: Secondary | ICD-10-CM | POA: Diagnosis not present

## 2023-10-28 DIAGNOSIS — F331 Major depressive disorder, recurrent, moderate: Secondary | ICD-10-CM | POA: Diagnosis not present

## 2023-10-28 DIAGNOSIS — F4381 Prolonged grief disorder: Secondary | ICD-10-CM | POA: Diagnosis not present

## 2023-10-29 ENCOUNTER — Encounter: Payer: Medicaid Other | Admitting: Obstetrics & Gynecology

## 2023-11-02 ENCOUNTER — Ambulatory Visit: Admitting: Obstetrics & Gynecology

## 2023-11-02 ENCOUNTER — Other Ambulatory Visit (HOSPITAL_COMMUNITY)
Admission: RE | Admit: 2023-11-02 | Discharge: 2023-11-02 | Disposition: A | Discharge status: Home / Self Care | Source: Ambulatory Visit | Attending: Obstetrics & Gynecology | Admitting: Obstetrics & Gynecology

## 2023-11-02 VITALS — BP 111/72 | HR 79 | Wt 215.0 lb

## 2023-11-02 DIAGNOSIS — Z3A26 26 weeks gestation of pregnancy: Secondary | ICD-10-CM

## 2023-11-02 DIAGNOSIS — O4703 False labor before 37 completed weeks of gestation, third trimester: Secondary | ICD-10-CM | POA: Diagnosis present

## 2023-11-02 DIAGNOSIS — O4702 False labor before 37 completed weeks of gestation, second trimester: Secondary | ICD-10-CM | POA: Diagnosis not present

## 2023-11-02 DIAGNOSIS — Z3480 Encounter for supervision of other normal pregnancy, unspecified trimester: Secondary | ICD-10-CM

## 2023-11-02 NOTE — Progress Notes (Signed)
   PRENATAL VISIT NOTE  Subjective:  Deborah Rice is a 28 y.o. G3P2002 at [redacted]w[redacted]d being seen today for ongoing prenatal care.  She is currently monitored for the following issues for this low-risk pregnancy and has Obesity affecting pregnancy and Supervision of other normal pregnancy, antepartum on their problem list.  Patient reports  increased preterm contractions lately, especially when working. 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  Contractions: Irregular. Vag. Bleeding: Scant (light brown, when wiping).  Movement: Present. Denies leaking of fluid.   The following portions of the patient's history were reviewed and updated as appropriate: allergies, current medications, past family history, past medical history, past social history, past surgical history and problem list.   Objective:   Vitals:   11/02/23 0939  BP: 111/72  Pulse: 79  Weight: 215 lb (97.5 kg)    Fetal Status: Fetal Heart Rate (bpm): 145   Movement: Present     General:  Alert, oriented and cooperative. Patient is in no acute distress.  Skin: Skin is warm and dry. No rash noted.   Cardiovascular: Normal heart rate noted  Respiratory: Normal respiratory effort, no problems with respiration noted  Abdomen: Soft, gravid, appropriate for gestational age.  Pain/Pressure: Present     Pelvic: Cervical exam performed in the presence of a chaperone Dilation: Closed Effacement (%): Thick Station: Ballotable. Thin white discharge noted, testing sample obtained  Extremities: Normal range of motion.  Edema: None  Mental Status: Normal mood and affect. Normal behavior. Normal judgment and thought content.   Assessment and Plan:  Pregnancy: G3P2002 at [redacted]w[redacted]d 1. Preterm uterine contractions in third trimester, antepartum Likely due to stress from prolonged work schedule, no cervical change. Patient reassured about closed cervix, advised to change work schedule if she can, keep hydrated, rest  when she can etc.  Will follow up test results.  - Cervicovaginal ancillary only  2. [redacted] weeks gestation of pregnancy 3. Supervision of other normal pregnancy, antepartum (Primary) No other concerns.  Third trimester labs, Tdap next visti, this was discussed with patient.  Preterm labor symptoms and general obstetric precautions including but not limited to vaginal bleeding, contractions, leaking of fluid and fetal movement were reviewed in detail with the patient. Please refer to After Visit Summary for other counseling recommendations.   Return in about 2 weeks (around 11/16/2023) for 2 hr GTT, 3rd trimester labs, TDap, OFFICE OB VISIT (MD or APP).  Future Appointments  Date Time Provider Department Center  11/19/2023  8:15 AM [facility]  11/19/2023  8:35 AM Federico Flake, MD [facility]  12/02/2023  8:30 AM [facility]  03/28/2024  4:15 PM Terri Piedra, DO [facility] None    Jaynie Collins, MD

## 2023-11-02 NOTE — Patient Instructions (Signed)
Return to office for any scheduled appointments. Call the office or go to the [facility] at [facility] if: You begin to have strong, frequent contractions Your water breaks.  Sometimes it is a big gush of fluid, sometimes it is just a trickle that keeps getting your underwear wet or running down your legs You have vaginal bleeding.  It is normal to have a small amount of spotting if your cervix was checked.  You do not feel your baby moving like normal.  If you do not, get something to eat and drink and lay down and focus on feeling your baby move.   If your baby is still not moving like normal, you should call the office or go to MAU. Any other obstetric concerns.  Oral Glucose Tolerance Test During Pregnancy Why am I having this test? The oral glucose tolerance test (GTT) is done to check how your body processes blood sugar (glucose). This is one of several tests used to diagnose diabetes that develops during pregnancy (gestational diabetes mellitus). Gestational diabetes is a short-term form of diabetes that some women develop while they are pregnant. It usually occurs during the second or third trimester of pregnancy and goes away after delivery. Testing, or screening, for gestational diabetes usually occurs around 28 of pregnancy. This test may also be needed earlier if: You have a history of gestational diabetes. There is a history of giving birth to very large babies or of losing pregnancies (having stillbirths). You have signs and symptoms of diabetes, such as: Changes in your eyesight. Tingling or numbness in your hands or feet. Changes in hunger, thirst, and urination, and these are not explained by your pregnancy. What is being tested? This test measures the amount of glucose in your blood at different times during a period of 2 hours. This shows how well your body can process glucose.  You will have three separate blood draws. What kind of sample is  taken?  Blood samples are required for this test. They are usually collected by inserting a needle into a blood vessel. How do I prepare for this test? For 3 days before your test, eat normally. Have plenty of carbohydrate-rich foods. You will be asked not to eat or drink anything other than water (to fast) starting 8-10 hours before the test. Tell a health care provider about: All medicines you are taking, including vitamins, herbs, eye drops, creams, and over-the-counter medicines. Any blood disorders you have. Any surgeries you have had. Any medical conditions you have. What happens during the test? First, your blood glucose will be measured. This is referred to as your fasting blood glucose because you fasted before the test. Then, you will drink a glucose solution that contains a certain amount of glucose. Your blood glucose will be measured again 1 and 2 hours after you drink the solution. This test takes about 2 hours to complete. You will need to stay at the testing location during this time. During the testing period: Do not eat or drink anything other than the glucose solution. Do not exercise. Do not use any products that contain nicotine or tobacco, such as cigarettes, e-cigarettes, and chewing tobacco. These can affect your test results. If you need help quitting, ask your health care provider. The testing procedure may vary among health care providers and hospitals. How are the results reported? Your results will be reported as milligrams of glucose per deciliter of blood (mg/dL) or millimoles per liter (mmol/L). There   is more than one source for screening and diagnosis reference values used to diagnose gestational diabetes. Your health care provider will compare your results to normal values that were established after testing a large group of people (reference values). Reference values may vary among labs and hospitals. For this test, reference values are: Fasting: 92 mg/dL 1  hour: 180 mg/dL  2 hour: 153 mg/dL   What do the results mean? Results below the reference values are considered normal. If one or more of your blood glucose levels are at or above the reference values, you will be diagnosed with gestational diabetes.  Talk with your health care provider about what your results mean. Questions to ask your health care provider Ask your health care provider, or the department that is doing the test: When will my results be ready? How will I get my results? What are my treatment options? What other tests do I need? What are my next steps? Summary The oral glucose tolerance test (GTT) is one of several tests used to diagnose diabetes that develops during pregnancy (gestational diabetes mellitus). Gestational diabetes is a short-term form of diabetes that some women develop while they are pregnant. You may also have this test if you have any symptoms or risk factors for this type of diabetes. Talk with your health care provider about what your results mean. This information is not intended to replace advice given to you by your health care provider. Make sure you discuss any questions you have with your health care provider.   TDaP Vaccine Pregnancy Get the Whooping Cough Vaccine While You Are Pregnant (CDC)  It is important for women to get the whooping cough vaccine in the third trimester of each pregnancy. Vaccines are the best way to prevent this disease. There are 2 different whooping cough vaccines. Both vaccines combine protection against whooping cough, tetanus and diphtheria, but they are for different age groups: Tdap: for everyone 11 years or older, including pregnant women  DTaP: for children 2 months through 6 years of age  You need the whooping cough vaccine during each of your pregnancies The recommended time to get the shot is during your 27th through 36th week of pregnancy, preferably during the earlier part of this time period. The Centers  for Disease Control and Prevention (CDC) recommends that pregnant women receive the whooping cough vaccine for adolescents and adults (called Tdap vaccine) during the third trimester of each pregnancy. The recommended time to get the shot is during your 27th through 36th week of pregnancy, preferably during the earlier part of this time period. This replaces the original recommendation that pregnant women get the vaccine only if they had not previously received it. The American College of Obstetricians and Gynecologists and the American College of Nurse-Midwives support this recommendation.  You should get the whooping cough vaccine while pregnant to pass protection to your baby frame support disabled and/or not supported in this browser  Learn why Laura decided to get the whooping cough vaccine in her 3rd trimester of pregnancy and how her baby girl was born with some protection against the disease. Also available on YouTube. After receiving the whooping cough vaccine, your body will create protective antibodies (proteins produced by the body to fight off diseases) and pass some of them to your baby before birth. These antibodies provide your baby some short-term protection against whooping cough in early life. These antibodies can also protect your baby from some of the more serious complications that come along   with whooping cough. Your protective antibodies are at their highest about 2 weeks after getting the vaccine, but it takes time to pass them to your baby. So the preferred time to get the whooping cough vaccine is early in your third trimester. The amount of whooping cough antibodies in your body decreases over time. That is why CDC recommends you get a whooping cough vaccine during each pregnancy. Doing so allows each of your babies to get the greatest number of protective antibodies from you. This means each of your babies will get the best protection possible against this disease.  Getting  the whooping cough vaccine while pregnant is better than getting the vaccine after you give birth Whooping cough vaccination during pregnancy is ideal so your baby will have short-term protection as soon as he is born. This early protection is important because your baby will not start getting his whooping cough vaccines until he is 2 months old. These first few months of life are when your baby is at greatest risk for catching whooping cough. This is also when he's at greatest risk for having severe, potentially life-threating complications from the infection. To avoid that gap in protection, it is best to get a whooping cough vaccine during pregnancy. You will then pass protection to your baby before he is born. To continue protecting your baby, he should get whooping cough vaccines starting at 2 months old. You may never have gotten the Tdap vaccine before and did not get it during this pregnancy. If so, you should make sure to get the vaccine immediately after you give birth, before leaving the hospital or birthing center. It will take about 2 weeks before your body develops protection (antibodies) in response to the vaccine. Once you have protection from the vaccine, you are less likely to give whooping cough to your newborn while caring for him. But remember, your baby will still be at risk for catching whooping cough from others. A recent study looked to see how effective Tdap was at preventing whooping cough in babies whose mothers got the vaccine while pregnant or in the hospital after giving birth. The study found that getting Tdap between 27 through 36 weeks of pregnancy is 85% more effective at preventing whooping cough in babies younger than 2 months old. Blood tests cannot tell if you need a whooping cough vaccine There are no blood tests that can tell you if you have enough antibodies in your body to protect yourself or your baby against whooping cough. Even if you have been sick with whooping  cough in the past or previously received the vaccine, you still should get the vaccine during each pregnancy. Breastfeeding may pass some protective antibodies onto your baby By breastfeeding, you may pass some antibodies you have made in response to the vaccine to your baby. When you get a whooping cough vaccine during your pregnancy, you will have antibodies in your breast milk that you can share with your baby as soon as your milk comes in. However, your baby will not get protective antibodies immediately if you wait to get the whooping cough vaccine until after delivering your baby. This is because it takes about 2 weeks for your body to create antibodies. Learn more about the health benefits of breastfeeding.  

## 2023-11-03 ENCOUNTER — Encounter: Payer: Self-pay | Admitting: Obstetrics & Gynecology

## 2023-11-03 LAB — CERVICOVAGINAL ANCILLARY ONLY
Bacterial Vaginitis (gardnerella): NEGATIVE
Candida Glabrata: NEGATIVE
Candida Vaginitis: NEGATIVE
Chlamydia: NEGATIVE
Comment: NEGATIVE
Comment: NEGATIVE
Comment: NEGATIVE
Comment: NEGATIVE
Comment: NEGATIVE
Comment: NORMAL
Neisseria Gonorrhea: NEGATIVE
Trichomonas: NEGATIVE

## 2023-11-16 ENCOUNTER — Ambulatory Visit: Admitting: Obstetrics & Gynecology

## 2023-11-16 ENCOUNTER — Other Ambulatory Visit

## 2023-11-16 VITALS — BP 124/77 | HR 84 | Wt 215.4 lb

## 2023-11-16 DIAGNOSIS — Z3480 Encounter for supervision of other normal pregnancy, unspecified trimester: Secondary | ICD-10-CM

## 2023-11-16 DIAGNOSIS — Z3A28 28 weeks gestation of pregnancy: Secondary | ICD-10-CM

## 2023-11-16 DIAGNOSIS — Z3483 Encounter for supervision of other normal pregnancy, third trimester: Secondary | ICD-10-CM | POA: Diagnosis not present

## 2023-11-16 DIAGNOSIS — Z3490 Encounter for supervision of normal pregnancy, unspecified, unspecified trimester: Secondary | ICD-10-CM

## 2023-11-16 DIAGNOSIS — Z23 Encounter for immunization: Secondary | ICD-10-CM

## 2023-11-16 NOTE — Progress Notes (Signed)
   PRENATAL VISIT NOTE  Subjective:  Deborah Rice is a 28 y.o. G3P2002 at [redacted]w[redacted]d being seen today for ongoing prenatal care.  She is currently monitored for the following issues for this low-risk pregnancy and has Obesity affecting pregnancy and Supervision of other normal pregnancy, antepartum on their problem list.  Patient reports no complaints.  Contractions: Irregular. Vag. Bleeding: None.  Movement: Present. Denies leaking of fluid.   The following portions of the patient's history were reviewed and updated as appropriate: allergies, current medications, past family history, past medical history, past social history, past surgical history and problem list.   Objective:   Vitals:   11/16/23 0848  BP: 124/77  Pulse: 84  Weight: 215 lb 6.4 oz (97.7 kg)    Fetal Status: Fetal Heart Rate (bpm): 145 Fundal Height: 32 cm Movement: Present     General:  Alert, oriented and cooperative. Patient is in no acute distress.  Skin: Skin is warm and dry. No rash noted.   Cardiovascular: Normal heart rate noted  Respiratory: Normal respiratory effort, no problems with respiration noted  Abdomen: Soft, gravid, appropriate for gestational age.  Pain/Pressure: Present     Pelvic: Cervical exam deferred        Extremities: Normal range of motion.  Edema: Trace  Mental Status: Normal mood and affect. Normal behavior. Normal judgment and thought content.   Assessment and Plan:  Pregnancy: G3P2002 at [redacted]w[redacted]d 1. Fundal height high for dates Growth scan already scheduled for 12/02/23, will follow up results  2. Need for Tdap vaccination - Tdap vaccine greater than or equal to 7yo IM given today  3. [redacted] weeks gestation of pregnancy 4. Supervision of other normal pregnancy, antepartum (Primary) Third trimester labs done today, will follow up results and manage accordingly. Third trimester expectations reviewed and all questions answered. Preterm labor symptoms and general obstetric precautions  including but not limited to vaginal bleeding, contractions, leaking of fluid and fetal movement were reviewed in detail with the patient. Please refer to After Visit Summary for other counseling recommendations.   Return in about 2 weeks (around 11/30/2023) for OFFICE OB VISIT (MD or APP).  Future Appointments  Date Time Provider Department Center  11/30/2023 10:55 AM Aziel Morgan, Kathrine Paris, MD [facility]  12/02/2023  8:30 AM [facility] US4 [facility]  12/14/2023  8:35 AM Zacheriah Stumpe, Kathrine Paris, MD [facility]  12/28/2023  8:35 AM Dodie Frees, Carles Cheadle, MD [facility]  01/11/2024  8:35 AM Izell Marsh, MD [facility]  01/18/2024  8:55 AM Granville Layer, MD [facility]  03/28/2024  4:15 PM Dellar Fenton, DO [facility] None    Lenoard Rad, MD

## 2023-11-16 NOTE — Patient Instructions (Signed)

## 2023-11-18 ENCOUNTER — Encounter: Payer: Self-pay | Admitting: Obstetrics & Gynecology

## 2023-11-18 LAB — RPR: RPR Ser Ql: NONREACTIVE

## 2023-11-18 LAB — CBC
Hematocrit: 35.2 % (ref 34.0–46.6)
Hemoglobin: 11.6 g/dL (ref 11.1–15.9)
MCH: 29.2 pg (ref 26.6–33.0)
MCHC: 33 g/dL (ref 31.5–35.7)
MCV: 89 fL (ref 79–97)
Platelets: 209 10*3/uL (ref 150–450)
RBC: 3.97 x10E6/uL (ref 3.77–5.28)
RDW: 12.8 % (ref 11.7–15.4)
WBC: 10.2 10*3/uL (ref 3.4–10.8)

## 2023-11-18 LAB — HIV ANTIBODY (ROUTINE TESTING W REFLEX): HIV Screen 4th Generation wRfx: NONREACTIVE

## 2023-11-18 LAB — GLUCOSE TOLERANCE, 2 HOURS W/ 1HR
Glucose, 1 hour: 97 mg/dL (ref 70–179)
Glucose, 2 hour: 100 mg/dL (ref 70–152)
Glucose, Fasting: 77 mg/dL (ref 70–91)

## 2023-11-19 ENCOUNTER — Encounter: Payer: Medicaid Other | Admitting: Family Medicine

## 2023-11-19 ENCOUNTER — Other Ambulatory Visit: Payer: Medicaid Other

## 2023-11-30 ENCOUNTER — Ambulatory Visit (INDEPENDENT_AMBULATORY_CARE_PROVIDER_SITE_OTHER): Admitting: Obstetrics & Gynecology

## 2023-11-30 ENCOUNTER — Encounter: Payer: Self-pay | Admitting: *Deleted

## 2023-11-30 VITALS — BP 121/73 | HR 85 | Wt 220.0 lb

## 2023-11-30 DIAGNOSIS — Z3A30 30 weeks gestation of pregnancy: Secondary | ICD-10-CM

## 2023-11-30 DIAGNOSIS — O99213 Obesity complicating pregnancy, third trimester: Secondary | ICD-10-CM

## 2023-11-30 DIAGNOSIS — Z3480 Encounter for supervision of other normal pregnancy, unspecified trimester: Secondary | ICD-10-CM

## 2023-11-30 NOTE — Patient Instructions (Signed)

## 2023-11-30 NOTE — Progress Notes (Signed)
   PRENATAL VISIT NOTE  Subjective:  Deborah Rice is a 28 y.o. G3P2002 at [redacted]w[redacted]d being seen today for ongoing prenatal care.  She is currently monitored for the following issues for this low-risk pregnancy and has Obesity affecting pregnancy and Supervision of other normal pregnancy, antepartum on their problem list.  Patient reports no complaints.  Contractions: Not present. Vag. Bleeding: None.  Movement: Present. Denies leaking of fluid.   The following portions of the patient's history were reviewed and updated as appropriate: allergies, current medications, past family history, past medical history, past social history, past surgical history and problem list.   Objective:   Vitals:   11/30/23 1102  BP: 121/73  Pulse: 85  Weight: 220 lb (99.8 kg)    Fetal Status: Fetal Heart Rate (bpm): 139-142   Movement: Present     General:  Alert, oriented and cooperative. Patient is in no acute distress.  Skin: Skin is warm and dry. No rash noted.   Cardiovascular: Normal heart rate noted  Respiratory: Normal respiratory effort, no problems with respiration noted  Abdomen: Soft, gravid, appropriate for gestational age.  Pain/Pressure: Absent     Pelvic: Cervical exam deferred        Extremities: Normal range of motion.  Edema: None  Mental Status: Normal mood and affect. Normal behavior. Normal judgment and thought content.   Assessment and Plan:  Pregnancy: G3P2002 at [redacted]w[redacted]d 1. Obesity affecting pregnancy in third trimester Growth scan to be done 12/02/2023, will follow up results and manage accordingly.  2. [redacted] weeks gestation of pregnancy 3. Supervision of other normal pregnancy, antepartum (Primary) Normal GTT and third trimester labs, reviewed with patient. Third trimester expectations reviewed and all questions answered. Preterm labor symptoms and general obstetric precautions including but not limited to vaginal bleeding, contractions, leaking of fluid and fetal movement were  reviewed in detail with the patient. Please refer to After Visit Summary for other counseling recommendations.   Return in about 2 weeks (around 12/14/2023) for OFFICE OB VISIT (MD or APP).  Future Appointments  Date Time Provider Department Center  12/02/2023  8:30 AM [facility] US4 [facility]  12/14/2023  8:35 AM Patrena Santalucia, Kathrine Paris, MD [facility]  12/28/2023  8:35 AM Constant, Carles Cheadle, MD [facility]  01/11/2024  8:35 AM Izell Marsh, MD [facility]  01/18/2024  8:55 AM Granville Layer, MD [facility]  03/28/2024  4:15 PM Dellar Fenton, DO [facility] None    Lenoard Rad, MD

## 2023-12-02 ENCOUNTER — Ambulatory Visit: Admitting: Obstetrics

## 2023-12-02 ENCOUNTER — Ambulatory Visit: Payer: Medicaid Other | Attending: Obstetrics

## 2023-12-02 ENCOUNTER — Other Ambulatory Visit: Payer: Self-pay | Admitting: *Deleted

## 2023-12-02 VITALS — BP 115/58 | HR 89

## 2023-12-02 DIAGNOSIS — Z3A31 31 weeks gestation of pregnancy: Secondary | ICD-10-CM

## 2023-12-02 DIAGNOSIS — O99213 Obesity complicating pregnancy, third trimester: Secondary | ICD-10-CM | POA: Insufficient documentation

## 2023-12-02 DIAGNOSIS — E669 Obesity, unspecified: Secondary | ICD-10-CM

## 2023-12-02 DIAGNOSIS — Z362 Encounter for other antenatal screening follow-up: Secondary | ICD-10-CM | POA: Insufficient documentation

## 2023-12-02 DIAGNOSIS — Z3480 Encounter for supervision of other normal pregnancy, unspecified trimester: Secondary | ICD-10-CM | POA: Insufficient documentation

## 2023-12-02 DIAGNOSIS — O99212 Obesity complicating pregnancy, second trimester: Secondary | ICD-10-CM | POA: Diagnosis present

## 2023-12-02 DIAGNOSIS — O3663X0 Maternal care for excessive fetal growth, third trimester, not applicable or unspecified: Secondary | ICD-10-CM | POA: Diagnosis not present

## 2023-12-02 NOTE — Progress Notes (Signed)
 MFM Consult Note  [name] is currently at [facility] and 0 days.  She was seen due to maternal obesity with a BMI of 35.    The patient reports that her fundal heights have been measuring larger than her gestational age.  She has screened negative for gestational diabetes in her current pregnancy.  On today's exam, the overall EFW of 4 pounds 14 ounces measures at the 98th percentile for gestational age.    There was normal amniotic fluid noted with a total AFI of 15.74 cm.    Fetal movements and fetal breathing movements were noted throughout today's exam.    Due to the large for gestational age fetus, a follow-up growth scan was scheduled in 5 weeks to estimate the fetal weight closer to delivery.  The patient stated that all of her questions were answered today.  A total of 20 minutes was spent counseling and coordinating the care for this patient.  Greater than 50% of the time was spent in direct face-to-face contact.

## 2023-12-13 NOTE — Progress Notes (Signed)
   PRENATAL VISIT NOTE  Subjective:  Deborah Rice is a 28 y.o. G3P2002 at [redacted]w[redacted]d being seen today for ongoing prenatal care.  She is currently monitored for the following issues for this low-risk pregnancy and has Obesity affecting pregnancy and Supervision of other normal pregnancy, antepartum on their problem list.  Patient reports no concerns.  Contractions: Not present. Vag. Bleeding: None.  Movement: Present. Denies leaking of fluid.   The following portions of the patient's history were reviewed and updated as appropriate: allergies, current medications, past family history, past medical history, past social history, past surgical history and problem list.   Objective:    Vitals:   12/16/23 1550  BP: 118/74  Pulse: 94  Weight: 227 lb (103 kg)    Fetal Status:  Fetal Heart Rate (bpm): 150 Fundal Height: 35 cm Movement: Present    General: Alert, oriented and cooperative. Patient is in no acute distress.  Skin: Skin is warm and dry. No rash noted.   Cardiovascular: Normal heart rate noted  Respiratory: Normal respiratory effort, no problems with respiration noted  Abdomen: Soft, gravid, appropriate for gestational age.  Pain/Pressure: Absent     Pelvic: Cervical exam deferred        Extremities: Normal range of motion.  Edema: Trace  Mental Status: Normal mood and affect. Normal behavior. Normal judgment and thought content.   Assessment and Plan:  Pregnancy: G3P2002 at [redacted]w[redacted]d 1. Supervision of other normal pregnancy, antepartum (Primary) - Doing well, feeling regular and vigorous fetal movement   2. [redacted] weeks gestation of pregnancy - FH elevated by 2 weeks. LGA by [place]. Followed by MFM.   3. Obesity affecting pregnancy in third trimester - Counseled good nutrition, exercise. Followed by MFM. Next appointment 01/06/24.   4. Fundal height high for dates - MFM to follow.   Preterm labor symptoms and general obstetric precautions including but not limited to vaginal  bleeding, contractions, leaking of fluid and fetal movement were reviewed in detail with the patient. Please refer to After Visit Summary for other counseling recommendations.   Return in about 2 weeks (around 12/30/2023) for LOB with GBS.  Future Appointments  Date Time Provider Department Center  12/28/2023  4:10 PM Constant, Peggy, MD [facility]  01/06/2024  3:00 PM [facility] PROVIDER 1 [facility]  01/06/2024  3:30 PM [facility] US4 [facility]  01/11/2024  2:30 PM Izell Marsh, MD [facility]  01/18/2024  2:30 PM Granville Layer, MD [facility]  01/25/2024  3:30 PM Anyanwu, Kathrine Paris, MD [facility]  02/01/2024  3:30 PM Izell Marsh, MD [facility]  03/28/2024  4:15 PM Dellar Fenton, DO [facility] None    Salomon Cree, CNM

## 2023-12-14 ENCOUNTER — Encounter: Admitting: Obstetrics & Gynecology

## 2023-12-16 ENCOUNTER — Ambulatory Visit: Admitting: Certified Nurse Midwife

## 2023-12-16 VITALS — BP 118/74 | HR 94 | Wt 227.0 lb

## 2023-12-16 DIAGNOSIS — Z3A33 33 weeks gestation of pregnancy: Secondary | ICD-10-CM

## 2023-12-16 DIAGNOSIS — Z3480 Encounter for supervision of other normal pregnancy, unspecified trimester: Secondary | ICD-10-CM

## 2023-12-16 DIAGNOSIS — O99213 Obesity complicating pregnancy, third trimester: Secondary | ICD-10-CM | POA: Diagnosis not present

## 2023-12-16 DIAGNOSIS — Z3490 Encounter for supervision of normal pregnancy, unspecified, unspecified trimester: Secondary | ICD-10-CM

## 2023-12-16 NOTE — Patient Instructions (Signed)
 Things to Try After 37 weeks to Encourage Labor/Get Ready for Labor:    Try the [employer] at [email or web address] daily to improve baby's position and encourage the onset of labor.  Walk a little and rest a little every day.  Change positions often.  Cervical Ripening: May try one or both Red Raspberry Leaf capsules or tea:  two 300mg  or 400mg  tablets with each meal, 2-3 times a day, or 1-3 cups of tea daily  Potential Side Effects Of Raspberry Leaf:  Most women do not experience any side effects from drinking raspberry leaf tea. However, nausea and loose stools are possible.  Evening Primrose Oil capsules: take 1 capsule by mouth and place one capsule in the vagina every night.    Some of the potential side effects:  Upset stomach  Loose stools or diarrhea  Headaches  Nausea  Sex can also help the cervix ripen and encourage labor onset.  5. Eating 6-8 dates per day can help soften and even dilate your cervix. You may eat them plain, in smoothies, or in energy balls.  They do contain sugar so eat with caution and balance with protein.

## 2023-12-16 NOTE — Progress Notes (Signed)
 ROB: Discuss LGA weighing 5 lbs   Another scan scheduled 6/12 ish

## 2023-12-28 ENCOUNTER — Ambulatory Visit: Admitting: Obstetrics and Gynecology

## 2023-12-28 ENCOUNTER — Encounter: Payer: Self-pay | Admitting: Obstetrics and Gynecology

## 2023-12-28 ENCOUNTER — Encounter: Admitting: Obstetrics and Gynecology

## 2023-12-28 VITALS — BP 129/83 | HR 91 | Wt 227.0 lb

## 2023-12-28 DIAGNOSIS — R002 Palpitations: Secondary | ICD-10-CM

## 2023-12-28 DIAGNOSIS — Z3480 Encounter for supervision of other normal pregnancy, unspecified trimester: Secondary | ICD-10-CM

## 2023-12-28 DIAGNOSIS — O99213 Obesity complicating pregnancy, third trimester: Secondary | ICD-10-CM | POA: Diagnosis not present

## 2023-12-28 DIAGNOSIS — O3660X0 Maternal care for excessive fetal growth, unspecified trimester, not applicable or unspecified: Secondary | ICD-10-CM | POA: Insufficient documentation

## 2023-12-28 DIAGNOSIS — Z3A34 34 weeks gestation of pregnancy: Secondary | ICD-10-CM

## 2023-12-28 NOTE — Progress Notes (Signed)
 ROB: Bilateral lower leg edema   Racing heart-randomly

## 2023-12-28 NOTE — Progress Notes (Signed)
   PRENATAL VISIT NOTE  Subjective:  Deborah Rice is a 28 y.o. G3P2002 at [redacted]w[redacted]d being seen today for ongoing prenatal care.  She is currently monitored for the following issues for this high-risk pregnancy and has Obesity affecting pregnancy and Supervision of other normal pregnancy, antepartum on their problem list.  Patient reports lower extremity swelling and heart palpitation at rest.  Contractions: Not present. Vag. Bleeding: None.  Movement: Present. Denies leaking of fluid.   The following portions of the patient's history were reviewed and updated as appropriate: allergies, current medications, past family history, past medical history, past social history, past surgical history and problem list.   Objective:    Vitals:   12/28/23 1457  BP: 129/83  Pulse: 91  Weight: 227 lb (103 kg)    Fetal Status:  Fetal Heart Rate (bpm): 124 Fundal Height: 37 cm Movement: Present    General: Alert, oriented and cooperative. Patient is in no acute distress.  Skin: Skin is warm and dry. No rash noted.   Cardiovascular: Normal heart rate noted  Respiratory: Normal respiratory effort, no problems with respiration noted  Abdomen: Soft, gravid, appropriate for gestational age.  Pain/Pressure: Absent     Pelvic: Cervical exam deferred        Extremities: Normal range of motion.  Edema: None  Mental Status: Normal mood and affect. Normal behavior. Normal judgment and thought content.   Assessment and Plan:  Pregnancy: G3P2002 at [redacted]w[redacted]d 1. Supervision of other normal pregnancy, antepartum (Primary) Patient is doing well  Patient undecided on contraception Reassurance provided regarding bilateral lower extremity edema which resolves at rest  2. Obesity affecting pregnancy in third trimester, unspecified obesity type   3. Heart palpitations Patient referred to cardiology  4. LGA Follow up growth ultrasound and timing of delivery per MFM  Preterm labor symptoms and general obstetric  precautions including but not limited to vaginal bleeding, contractions, leaking of fluid and fetal movement were reviewed in detail with the patient. Please refer to After Visit Summary for other counseling recommendations.   Return in about 2 weeks (around 01/11/2024) for in person, ROB, High risk.  Future Appointments  Date Time Provider Department Center  12/28/2023  4:10 PM Verlyn Goad, MD [facility]  01/06/2024  3:00 PM [facility] PROVIDER 1 [facility]  01/06/2024  3:30 PM [facility] US4 [facility]  01/11/2024 10:35 AM Izell Marsh, MD [facility]  01/18/2024  9:35 AM Granville Layer, MD [facility]  01/25/2024 10:35 AM Thurmon Florida, Kathrine Paris, MD [facility]  02/01/2024 10:55 AM Izell Marsh, MD [facility]  03/28/2024  4:15 PM Dellar Fenton, DO [facility] None    Verlyn Goad, MD

## 2024-01-01 ENCOUNTER — Inpatient Hospital Stay (HOSPITAL_COMMUNITY)
Admission: AD | Admit: 2024-01-01 | Discharge: 2024-01-01 | Disposition: A | Discharge status: Home / Self Care | Attending: Obstetrics & Gynecology | Admitting: Obstetrics & Gynecology

## 2024-01-01 ENCOUNTER — Encounter (HOSPITAL_COMMUNITY): Payer: Self-pay | Admitting: Obstetrics & Gynecology

## 2024-01-01 DIAGNOSIS — R030 Elevated blood-pressure reading, without diagnosis of hypertension: Secondary | ICD-10-CM | POA: Diagnosis not present

## 2024-01-01 DIAGNOSIS — Z3689 Encounter for other specified antenatal screening: Secondary | ICD-10-CM

## 2024-01-01 DIAGNOSIS — R519 Headache, unspecified: Secondary | ICD-10-CM | POA: Diagnosis not present

## 2024-01-01 DIAGNOSIS — Z87891 Personal history of nicotine dependence: Secondary | ICD-10-CM | POA: Diagnosis not present

## 2024-01-01 DIAGNOSIS — Z3A35 35 weeks gestation of pregnancy: Secondary | ICD-10-CM

## 2024-01-01 DIAGNOSIS — O26893 Other specified pregnancy related conditions, third trimester: Secondary | ICD-10-CM | POA: Diagnosis not present

## 2024-01-01 DIAGNOSIS — O10913 Unspecified pre-existing hypertension complicating pregnancy, third trimester: Secondary | ICD-10-CM | POA: Insufficient documentation

## 2024-01-01 LAB — COMPREHENSIVE METABOLIC PANEL WITH GFR
ALT: 14 U/L (ref 0–44)
AST: 23 U/L (ref 15–41)
Albumin: 3.1 g/dL — ABNORMAL LOW (ref 3.5–5.0)
Alkaline Phosphatase: 80 U/L (ref 38–126)
Anion gap: 11 (ref 5–15)
BUN: 7 mg/dL (ref 6–20)
CO2: 19 mmol/L — ABNORMAL LOW (ref 22–32)
Calcium: 9.5 mg/dL (ref 8.9–10.3)
Chloride: 105 mmol/L (ref 98–111)
Creatinine, Ser: 0.65 mg/dL (ref 0.44–1.00)
GFR, Estimated: >60 mL/min (ref >60)
Glucose, Bld: 120 mg/dL — ABNORMAL HIGH (ref 70–99)
Potassium: 3.4 mmol/L — ABNORMAL LOW (ref 3.5–5.1)
Sodium: 135 mmol/L (ref 135–145)
Total Bilirubin: 0.5 mg/dL (ref 0.0–1.2)
Total Protein: 6.5 g/dL (ref 6.5–8.1)

## 2024-01-01 LAB — CBC
HCT: 33.2 % — ABNORMAL LOW (ref 36.0–46.0)
Hemoglobin: 11.3 g/dL — ABNORMAL LOW (ref 12.0–15.0)
MCH: 29.7 pg (ref 26.0–34.0)
MCHC: 34 g/dL (ref 30.0–36.0)
MCV: 87.1 fL (ref 80.0–100.0)
Platelets: 204 10*3/uL (ref 150–400)
RBC: 3.81 MIL/uL — ABNORMAL LOW (ref 3.87–5.11)
RDW: 14 % (ref 11.5–15.5)
WBC: 12 10*3/uL — ABNORMAL HIGH (ref 4.0–10.5)
nRBC: 0 % (ref 0.0–0.2)

## 2024-01-01 LAB — PROTEIN / CREATININE RATIO, URINE
Creatinine, Urine: 132 mg/dL
Protein Creatinine Ratio: 0.07 mg/mg{creat} (ref 0.00–0.15)
Total Protein, Urine: 9 mg/dL

## 2024-01-01 MED ORDER — CYCLOBENZAPRINE HCL 10 MG PO TABS
10.0000 mg | ORAL_TABLET | Freq: Two times a day (BID) | ORAL | 0 refills | Status: AC | PRN
Start: 2024-01-01 — End: ?

## 2024-01-01 MED ORDER — ACETAMINOPHEN-CAFFEINE 500-65 MG PO TABS
2.0000 | ORAL_TABLET | Freq: Once | ORAL | Status: AC
  Administered 2024-01-01: 2 via ORAL
  Filled 2024-01-01: qty 2

## 2024-01-01 NOTE — MAU Provider Note (Signed)
 Chief Complaint:  Hypertension (148/80 at home)   HPI     Deborah Rice is a 28 y.o. G3P2002 at [redacted]w[redacted]d who presents to maternity admissions reporting she had elevated BP's at home today at 148/80 and 131/70. She has no DX of hypertension in this pregnancy but has been self monitoring. She also reports that she had a HA last night with no resolve from Tylenol . She reports her HA is back now and reporting it 5/10 on pain scale. Denies any visual changes, RUQ pain, N/V, SOB, or Chest pain.   Offers no OB c/o no VB, LOF, has irregular ctx's and reports good FM's   Pregnancy Course: [facility] ( Prenatal Records reviewed)  Past Medical History:  Diagnosis Date   Anxiety 04/06/2019   Asthma    Chlamydia 12/02/2021   Depression 04/06/2019   Low TSH level 09/10/2018   Normal 03/29/2019   Neg rpt at 28wks Normal fT4   OB History  Gravida Para Term Preterm AB Living  3 2 2   2   SAB IAB Ectopic Multiple Live Births     0 2    # Outcome Date GA Lbr Len/2nd Weight Sex Type Anes PTL Lv  3 Current           2 Term 03/01/19 [redacted]w[redacted]d 05:37 / 00:03 3565 g M Vag-Spont None  LIV  1 Term 07/28/15   3175 g M Vag-Spont  N LIV   Past Surgical History:  Procedure Laterality Date   DILATION AND EVACUATION N/A 03/11/2019   Procedure: DILATATION AND EVACUATION;  Surgeon: Verlyn Goad, MD;  Location: [facility] OR;  Service: Gynecology;  Laterality: N/A;   INSERTION OF IMPLANON  ROD  03/10/2022   REMOVAL OF IMPLANON  ROD  03/10/2022   REMOVAL OF IMPLANON  ROD  03/18/2022   WISDOM TOOTH EXTRACTION  2014   Family History  Problem Relation Age of Onset   Hyperlipidemia Father    Asthma Sister    Asthma Brother    Cancer Maternal Aunt    Cancer Maternal Grandmother    Diabetes Maternal Grandmother    Social History   Tobacco Use   Smoking status: Never   Smokeless tobacco: Never  Vaping Use   Vaping status: Former  Substance Use Topics   Alcohol use: Not Currently    Alcohol/week: 1.0 standard drink of alcohol     Types: 1 Glasses of wine per week   Drug use: Not Currently   No Known Allergies No medications prior to admission.    I have reviewed patient's Past Medical Hx, Surgical Hx, Family Hx, Social Hx, medications and allergies.   ROS  Pertinent items noted in HPI and remainder of comprehensive ROS otherwise negative.   PHYSICAL EXAM   Patient Vitals for the past 24 hrs:  BP Temp Temp src Pulse Resp SpO2 Height Weight  01/01/24 1715 138/62 -- -- 92 -- 99 % -- --  01/01/24 1700 132/69 -- -- 99 -- 98 % -- --  01/01/24 1645 120/61 -- -- 89 -- 98 % -- --  01/01/24 1615 117/63 -- -- 94 -- 100 % -- --  01/01/24 1610 -- -- -- -- -- 100 % -- --  01/01/24 1600 -- -- -- -- -- 100 % -- --  01/01/24 1554 133/67 99.1 F (37.3 C) Oral 91 16 100 % 5\' 3"  (1.6 m) 103.4 kg    Constitutional: Well-developed, obese  female in no acute distress.  Cardiovascular: normal rate & rhythm, warm and well-perfused Respiratory:  normal effort, no problems with respiration noted GI: Abd soft, non-tender, no ruq pain illicited MS: Extremities nontender, no edema, normal ROM Neurologic: Alert and oriented x 4.  GU: no CVA tenderness Pelvic: deferred     Fetal Tracing: Baseline: 130 Variability: moderate Accelerations: present Decelerations: absent Toco: irregular ctx's    Labs: Results for orders placed or performed during the hospital encounter of 01/01/24 (from the past 24 hours)  Protein / creatinine ratio, urine     Status: None   Collection Time: 01/01/24  4:11 PM  Result Value Ref Range   Creatinine, Urine 132 mg/dL   Total Protein, Urine 9 mg/dL   Protein Creatinine Ratio 0.07 0.00 - 0.15 mg/mg[Cre]  CBC     Status: Abnormal   Collection Time: 01/01/24  4:17 PM  Result Value Ref Range   WBC 12.0 (H) 4.0 - 10.5 K/uL   RBC 3.81 (L) 3.87 - 5.11 MIL/uL   Hemoglobin 11.3 (L) 12.0 - 15.0 g/dL   HCT 16.1 (L) 09.6 - 04.5 %   MCV 87.1 80.0 - 100.0 fL   MCH 29.7 26.0 - 34.0 pg   MCHC 34.0 30.0  - 36.0 g/dL   RDW 40.9 81.1 - 91.4 %   Platelets 204 150 - 400 K/uL   nRBC 0.0 0.0 - 0.2 %  Comprehensive metabolic panel     Status: Abnormal   Collection Time: 01/01/24  4:17 PM  Result Value Ref Range   Sodium 135 135 - 145 mmol/L   Potassium 3.4 (L) 3.5 - 5.1 mmol/L   Chloride 105 98 - 111 mmol/L   CO2 19 (L) 22 - 32 mmol/L   Glucose, Bld 120 (H) 70 - 99 mg/dL   BUN 7 6 - 20 mg/dL   Creatinine, Ser 7.82 0.44 - 1.00 mg/dL   Calcium 9.5 8.9 - 95.6 mg/dL   Total Protein 6.5 6.5 - 8.1 g/dL   Albumin 3.1 (L) 3.5 - 5.0 g/dL   AST 23 15 - 41 U/L   ALT 14 0 - 44 U/L   Alkaline Phosphatase 80 38 - 126 U/L   Total Bilirubin 0.5 0.0 - 1.2 mg/dL   GFR, Estimated >21 >30 mL/min   Anion gap 11 5 - 15    Imaging:  No results found.  MDM & MAU COURSE  MDM:  HIGH   Hypertension in pregnancy with HA CBC, CMP, PC Ration ( [facility]LL[facility] Labs) Excedrin for HA BP monitoring NST for GA and fetal well being  Labs unremarkable BP's normotensive HA is not resolved but better ( Will plan to discharge home with Flexeril since patient is driving)  NST : Reactive Cat 1    MAU Course: Orders Placed This Encounter  Procedures   CBC   Comprehensive metabolic panel   Protein / creatinine ratio, urine   Discharge patient Discharge disposition: 01-Home or Self Care; Discharge patient date: 01/01/2024   Meds ordered this encounter  Medications   acetaminophen -caffeine (EXCEDRIN TENSION HEADACHE) 500-65 MG per tablet 2 tablet   cyclobenzaprine (FLEXERIL) 10 MG tablet    Sig: Take 1 tablet (10 mg total) by mouth 2 (two) times daily as needed for muscle spasms.    Dispense:  20 tablet    Refill:  0    Supervising Provider:   PRATT, TANYA S [[id number]]   I have reviewed the patient chart and performed the physical exam . I have ordered & interpreted the lab results and reviewed and interpreted the NST Medications ordered  as stated below.  A/P as described below.  Counseling and education provided and  patient agreeable  with plan as described below. Verbalized understanding.    ASSESSMENT   1. Nonintractable headache, unspecified chronicity pattern, unspecified headache type   2. [redacted] weeks gestation of pregnancy   3. Elevated blood pressure reading without diagnosis of hypertension   4. NST (non-stress test) reactive on fetal surveillance     PLAN  Discharge home in stable condition with return precautions.   Follow [facility] with [facility] Provider   See AVS for full description of information given to the patient including both verbal and written. Patient verbalized understanding and agrees with the plan as described above.     Allergies as of 01/01/2024   No Known Allergies      Medication List     STOP taking these medications    clotrimazole -betamethasone  cream Commonly known as: LOTRISONE    Safety Seal Miscellaneous Misc       TAKE these medications    acetaminophen  500 MG tablet Commonly known as: TYLENOL  Take 500 mg by mouth every 6 (six) hours as needed.   cyclobenzaprine 10 MG tablet Commonly known as: FLEXERIL Take 1 tablet (10 mg total) by mouth 2 (two) times daily as needed for muscle spasms.   prenatal multivitamin Tabs tablet Take 1 tablet by mouth daily at 12 noon.        Debbe Fail, MSN, [facility], [facility] [employer]

## 2024-01-01 NOTE — MAU Note (Signed)
..  Deborah Rice is a 28 y.o. at 102w2d here in MAU reporting: high blood pressure at home. Patient took her blood pressure with a home automatic cuff in sitting position this morning received a reading of 148/80, subsequent bp was 131/70.   Does not have a gestational hypertension diagnosis. No hx of gestation htn. Pt has been voluntarily self monitoring.   Pt has had a HA since last night. Rates pain at a 5/10. Tried tylenol  (500mg ) last night 2130.  Denies recent vision changes (occasionally sees spots while showering). Has had some RUQ pain which feels like sharp, sudden pain in certain positions. Has noticed increased swelling in ankles in the last three weeks.   Reports occasional strong contractions with irregular frequency, roughly 15 minutes apart. Denies leaking fluid, denies vaginal bleeding.   PT reports that it is hard to tell if baby is moving is moving as much as normal because her belly has felt very tight.   Pain score: 5/10 headache Vitals:   01/01/24 1554  BP: 133/67  Pulse: 91  Resp: 16  Temp: 99.1 F (37.3 C)  SpO2: 100%     FHT:150  Lab orders placed from triage:  UA, protein creatinine

## 2024-01-04 ENCOUNTER — Encounter: Payer: Self-pay | Admitting: *Deleted

## 2024-01-06 ENCOUNTER — Ambulatory Visit: Admitting: Obstetrics and Gynecology

## 2024-01-06 ENCOUNTER — Ambulatory Visit: Attending: Obstetrics and Gynecology

## 2024-01-06 DIAGNOSIS — O3660X0 Maternal care for excessive fetal growth, unspecified trimester, not applicable or unspecified: Secondary | ICD-10-CM | POA: Insufficient documentation

## 2024-01-06 DIAGNOSIS — O3663X0 Maternal care for excessive fetal growth, third trimester, not applicable or unspecified: Secondary | ICD-10-CM | POA: Diagnosis not present

## 2024-01-06 DIAGNOSIS — E669 Obesity, unspecified: Secondary | ICD-10-CM | POA: Diagnosis not present

## 2024-01-06 DIAGNOSIS — O99213 Obesity complicating pregnancy, third trimester: Secondary | ICD-10-CM | POA: Diagnosis not present

## 2024-01-06 DIAGNOSIS — Z3A36 36 weeks gestation of pregnancy: Secondary | ICD-10-CM | POA: Diagnosis not present

## 2024-01-06 NOTE — Progress Notes (Signed)
 Maternal-Fetal Medicine Consultation Name: Marlana Mckowen MRN: [id number]  G3 P2002 at 36-weeks' gestation.  Patient is here for fetal growth assessment.  On previous ultrasound, the estimated fetal weight was at the 98th percentile. Patient does not have gestational diabetes.  Blood pressure today at our office is 119/62 mmHg. Her previous children weighed around 7 pounds at birth.  She had 2 term vaginal deliveries.  Ultrasound On today's ultrasound, the estimated fetal weight is at the 91st percentile and the abdominal circumference measurement at the 99 percentile.  Amniotic fluid is normal good fetal activity seen.  Cephalic presentation.  I reassured the patient of the findings.  I counseled the patient that ultrasound has limitations in accurately estimating fetal weights.  Given that she had 2 term vaginal deliveries, I recommend vaginal delivery.  Patient is 5 feet 3 inches tall. Patient reports her blood pressures have been higher at home monitoring.  I discussed the normal parameters.  If she has persistent systolic blood pressure reading of 140 or higher and/or diastolic blood pressure of 90 mmHg or higher, she will be presumed to have gestational hypertension. We recommend delivery at [redacted] weeks gestation for gestational hypertension/preeclampsia.  Recommendations -No follow-up appointments were made.   Consultation including face-to-face (more than 50%) counseling 20 minutes.

## 2024-01-11 ENCOUNTER — Other Ambulatory Visit (HOSPITAL_COMMUNITY)
Admission: RE | Admit: 2024-01-11 | Discharge: 2024-01-11 | Disposition: A | Discharge status: Home / Self Care | Source: Ambulatory Visit | Attending: Obstetrics and Gynecology | Admitting: Obstetrics and Gynecology

## 2024-01-11 ENCOUNTER — Encounter: Admitting: Obstetrics and Gynecology

## 2024-01-11 ENCOUNTER — Ambulatory Visit: Admitting: Obstetrics and Gynecology

## 2024-01-11 VITALS — BP 134/82 | Wt 225.6 lb

## 2024-01-11 DIAGNOSIS — O3660X0 Maternal care for excessive fetal growth, unspecified trimester, not applicable or unspecified: Secondary | ICD-10-CM | POA: Diagnosis not present

## 2024-01-11 DIAGNOSIS — Z3480 Encounter for supervision of other normal pregnancy, unspecified trimester: Secondary | ICD-10-CM | POA: Insufficient documentation

## 2024-01-11 DIAGNOSIS — Z6839 Body mass index (BMI) 39.0-39.9, adult: Secondary | ICD-10-CM | POA: Diagnosis not present

## 2024-01-11 DIAGNOSIS — Z3A36 36 weeks gestation of pregnancy: Secondary | ICD-10-CM | POA: Diagnosis not present

## 2024-01-11 NOTE — Progress Notes (Signed)
   PRENATAL VISIT NOTE  Subjective:  Deborah Rice is a 28 y.o. G3P2002 at [redacted]w[redacted]d being seen today for ongoing prenatal care.  She is currently monitored for the following issues for this low-risk pregnancy and has Obesity affecting pregnancy; Supervision of other normal pregnancy, antepartum; and LGA (large for gestational age) fetus affecting mother, antepartum on their problem list.  Patient reports doing well overall. Notes BP is trending up and has had 2 SBP of 140 at home but no elevated BP at appointments or MAU on 6/7. No HA, vis changes, CP/SOB, RUQ/epigastric pain.  Contractions: Irritability. Vag. Bleeding: None.  Movement: Present. Denies leaking of fluid.   The following portions of the patient's history were reviewed and updated as appropriate: allergies, current medications, past family history, past medical history, past social history, past surgical history and problem list.   Objective:   Vitals:   01/11/24 1049  BP: 134/82  Weight: 225 lb 9.6 oz (102.3 kg)    Fetal Status: Fetal Heart Rate (bpm): 141   Movement: Present     General:  Alert, oriented and cooperative. Patient is in no acute distress.  Skin: Skin is warm and dry. No rash noted.   Cardiovascular: Normal heart rate noted  Respiratory: Normal respiratory effort, no problems with respiration noted  Abdomen: Soft, gravid, appropriate for gestational age.  Pain/Pressure: Present      Assessment and Plan:  Pregnancy: G3P2002 at [redacted]w[redacted]d 1. Supervision of other normal pregnancy, antepartum (Primary) 2. [redacted] weeks gestation of pregnancy Reviewed that we will be monitoring BP w/ weekly appointments. If she meets criteria for gHTN plan for IOL any time after 37 weeks. Symptoms of preeclampsia reviewed. - Cervicovaginal ancillary only - Strep Gp B NAA  3. Excessive fetal growth affecting pregnancy, antepartum, single or unspecified fetus 4. BMI 39.0-39.9,adult @36 /0 3284g (91%), AC 99%, normal AFI Normal GDM  testing, TWG 20lbs Given projected EFW will always be < 5000g, would not recommend primary cesarean delivery Discussed pros/cons of IOL at 39 weeks. Reviewed IOL has not been proven to reduce risks/prevent harm in setting of LGA Will see how BP evolves and discuss further and next couple of visits  Please refer to After Visit Summary for other counseling recommendations.   Return in about 1 week (around 01/18/2024) for return OB at 37 weeks.  Future Appointments  Date Time Provider Department Center  01/18/2024  9:35 AM Granville Layer, MD [facility]  01/25/2024 10:35 AM Thurmon [place], Kathrine Paris, MD [facility]  02/01/2024 10:55 AM Izell Marsh, MD [facility]  03/17/2024  4:20 PM Jerryl Morin, DO [facility] None  03/28/2024  4:15 PM Dellar Fenton, DO [facility] None   Izell Marsh, MD

## 2024-01-11 NOTE — Progress Notes (Signed)
 ROB: LGA

## 2024-01-12 ENCOUNTER — Ambulatory Visit: Payer: Self-pay | Admitting: Obstetrics and Gynecology

## 2024-01-12 DIAGNOSIS — Z3480 Encounter for supervision of other normal pregnancy, unspecified trimester: Secondary | ICD-10-CM

## 2024-01-12 LAB — CERVICOVAGINAL ANCILLARY ONLY
Chlamydia: NEGATIVE
Comment: NEGATIVE
Comment: NORMAL
Neisseria Gonorrhea: NEGATIVE

## 2024-01-13 LAB — STREP GP B NAA: Strep Gp B NAA: POSITIVE — AB

## 2024-01-18 ENCOUNTER — Encounter: Admitting: Family Medicine

## 2024-01-18 ENCOUNTER — Encounter: Payer: Self-pay | Admitting: Family Medicine

## 2024-01-18 ENCOUNTER — Ambulatory Visit: Admitting: Family Medicine

## 2024-01-18 VITALS — BP 111/78 | HR 88 | Wt 224.0 lb

## 2024-01-18 DIAGNOSIS — O3660X0 Maternal care for excessive fetal growth, unspecified trimester, not applicable or unspecified: Secondary | ICD-10-CM

## 2024-01-18 DIAGNOSIS — Z3A37 37 weeks gestation of pregnancy: Secondary | ICD-10-CM | POA: Diagnosis not present

## 2024-01-18 DIAGNOSIS — O99820 Streptococcus B carrier state complicating pregnancy: Secondary | ICD-10-CM | POA: Insufficient documentation

## 2024-01-18 DIAGNOSIS — O3663X1 Maternal care for excessive fetal growth, third trimester, fetus 1: Secondary | ICD-10-CM | POA: Diagnosis not present

## 2024-01-18 DIAGNOSIS — Z3480 Encounter for supervision of other normal pregnancy, unspecified trimester: Secondary | ICD-10-CM

## 2024-01-18 NOTE — Progress Notes (Signed)
 ROB  Declines cervix check today.  HAD:Endpupcz   CC: None

## 2024-01-18 NOTE — Progress Notes (Signed)
   PRENATAL VISIT NOTE  Subjective:  Deborah Rice is a 28 y.o. G3P2002 at [redacted]w[redacted]d being seen today for ongoing prenatal care.  She is currently monitored for the following issues for this low-risk pregnancy and has Obesity affecting pregnancy; Supervision of other normal pregnancy, antepartum; and LGA (large for gestational age) fetus affecting mother, antepartum on their problem list.  Patient reports no complaints.  Contractions: Irritability. Vag. Bleeding: None.  Movement: Present. Denies leaking of fluid.   The following portions of the patient's history were reviewed and updated as appropriate: allergies, current medications, past family history, past medical history, past social history, past surgical history and problem list.   Objective:    Vitals:   01/18/24 0939  BP: 111/78  Pulse: 88  Weight: 224 lb (101.6 kg)    Fetal Status:  Fetal Heart Rate (bpm): 144 Fundal Height: 37 cm Movement: Present Presentation: Vertex  General: Alert, oriented and cooperative. Patient is in no acute distress.  Skin: Skin is warm and dry. No rash noted.   Cardiovascular: Normal heart rate noted  Respiratory: Normal respiratory effort, no problems with respiration noted  Abdomen: Soft, gravid, appropriate for gestational age.  Pain/Pressure: Present     Pelvic: Cervical exam deferred        Extremities: Normal range of motion.  Edema: None  Mental Status: Normal mood and affect. Normal behavior. Normal judgment and thought content.   Assessment and Plan:  Pregnancy: G3P2002 at [redacted]w[redacted]d 1. Supervision of other normal pregnancy, antepartum (Primary) Continue routine prenatal care.  2. Excessive fetal growth affecting pregnancy, antepartum, single or unspecified fetus LGA noted on u/s. Pelvis proven to almost 8 lbs.  3. [redacted] weeks gestation of pregnancy   4. Group B Streptococcus carrier, +RV culture, currently pregnant Will need treatment in labor    Term labor symptoms and general  obstetric precautions including but not limited to vaginal bleeding, contractions, leaking of fluid and fetal movement were reviewed in detail with the patient. Please refer to After Visit Summary for other counseling recommendations.   Return in 1 week (on 01/25/2024).  Future Appointments  Date Time Provider Department Center  01/25/2024 10:35 AM Anyanwu, Gloris LABOR, MD [facility]  02/01/2024 10:55 AM Erik Kieth BROCKS, MD [facility]  03/17/2024  4:20 PM Sheena Pugh, DO [facility] None  03/28/2024  4:15 PM Alm Delon SAILOR, DO [facility] None    Glenys GORMAN Birk, MD

## 2024-01-25 ENCOUNTER — Encounter: Admitting: Obstetrics & Gynecology

## 2024-01-25 ENCOUNTER — Encounter: Payer: Self-pay | Admitting: Obstetrics & Gynecology

## 2024-01-25 ENCOUNTER — Encounter: Payer: Self-pay | Admitting: *Deleted

## 2024-01-25 ENCOUNTER — Ambulatory Visit: Admitting: Obstetrics & Gynecology

## 2024-01-25 VITALS — BP 126/78 | HR 84 | Wt 227.0 lb

## 2024-01-25 DIAGNOSIS — O3660X0 Maternal care for excessive fetal growth, unspecified trimester, not applicable or unspecified: Secondary | ICD-10-CM

## 2024-01-25 DIAGNOSIS — Z3A38 38 weeks gestation of pregnancy: Secondary | ICD-10-CM

## 2024-01-25 DIAGNOSIS — Z3480 Encounter for supervision of other normal pregnancy, unspecified trimester: Secondary | ICD-10-CM

## 2024-01-25 NOTE — Patient Instructions (Addendum)
 Induction of labor scheduled on January 27, 2024 daytime.  Return to office for any scheduled appointments. Call the office or go to the MAU at [facility] if: You begin to have strong, frequent contractions Your water breaks.  Sometimes it is a big gush of fluid, sometimes it is just a trickle that keeps getting your underwear wet or running down your legs You have vaginal bleeding.  It is normal to have a small amount of spotting if your cervix was checked.  You do not feel your baby moving like normal.  If you do not, get something to eat and drink and lay down and focus on feeling your baby move.   If your baby is still not moving like normal, you should call the office or go to MAU. Any other obstetric concerns.

## 2024-01-25 NOTE — Progress Notes (Signed)
 PRENATAL VISIT NOTE  Subjective:  Deborah Rice is a 28 y.o. G3P2002 at [redacted]w[redacted]d being seen today for ongoing prenatal care.  She is currently monitored for the following issues for this low-risk pregnancy and has Obesity affecting pregnancy; Supervision of other normal pregnancy, antepartum; LGA (large for gestational age) fetus affecting mother, antepartum; and Group B Streptococcus carrier, +RV culture, currently pregnant on their problem list.  Patient reports no complaints.  Contractions: Irregular. Vag. Bleeding: None.  Movement: Present. Denies leaking of fluid.   The following portions of the patient's history were reviewed and updated as appropriate: allergies, current medications, past family history, past medical history, past social history, past surgical history and problem list.   Objective:    Vitals:   01/25/24 1041  BP: 126/78  Pulse: 84  Weight: 227 lb (103 kg)    Fetal Status:  Fetal Heart Rate (bpm): 135   Movement: Present Presentation: Vertex  General: Alert, oriented and cooperative. Patient is in no acute distress.  Skin: Skin is warm and dry. No rash noted.   Cardiovascular: Normal heart rate noted  Respiratory: Normal respiratory effort, no problems with respiration noted  Abdomen: Soft, gravid, appropriate for gestational age.  Pain/Pressure: Present     Pelvic: Cervical exam performed in the presence of a chaperone Dilation: 1 Effacement (%): Thick Station: Ballotable  Extremities: Normal range of motion.  Edema: None  Mental Status: Normal mood and affect. Normal behavior. Normal judgment and thought content.   US  MFM OB FOLLOW UP Result Date: 01/06/2024 ----------------------------------------------------------------------  OBSTETRICS REPORT                       (Signed Final 01/06/2024 05:17 pm) ---------------------------------------------------------------------- Patient Info  ID #:       [id number]                          D.O.B.:  [birth year] (27  yrs)(F)  Name:       Deborah Rice                   Visit Date: 01/06/2024 03:07 pm ---------------------------------------------------------------------- Performed By  Attending:        Fredia Fresh MD        Ref. Address:     [address]                                                             [address]  Performed By:     Deborah Rice       [facility]  Referred By:      Deborah Rice ---------------------------------------------------------------------- Orders  #  Description                           Code        Ordered By  1  US  MFM OB FOLLOW UP                   [phone]    Deborah Rice ----------------------------------------------------------------------  #  Order #                     Accession #                Episode #  1  [id number]                   [id number]                 [id number] ---------------------------------------------------------------------- Indications  Large for gestational age fetus affecting      O36.60X0  management of mother  Obesity complicating pregnancy, third          O99.213  trimester (BMI 35)  Encounter for other antenatal screening        Z36.2  follow-up  [redacted] weeks gestation of pregnancy                Z3A.36  Low Risk NIPS   Neg AFP  2hr GTT wnl ---------------------------------------------------------------------- Vital Signs  BP:          119/62 ---------------------------------------------------------------------- Fetal Evaluation  Num Of Fetuses:         1  Fetal Heart Rate(bpm):  132  Cardiac Activity:       Observed  Presentation:           Cephalic  Placenta:               Anterior  P. Cord Insertion:      Previously seen  Amniotic Fluid  AFI FV:      Subjectively low-normal  AFI Sum(cm)     %Tile       Largest Pocket(cm)  9.5              18          2.78  RUQ(cm)       RLQ(cm)       LUQ(cm)        LLQ(cm)  2.78          1.73          2.53           2.46 ---------------------------------------------------------------------- Biometry  BPD:      90.7  mm     G. Age:  36w 5d         78  %    CI:        77.06   %    70 - 86  FL/HC:      21.2   %    20.1 - 22.1  HC:      327.2  mm     G. Age:  37w 1d         47  %    HC/AC:      0.94        0.93 - 1.11  AC:      349.9  mm     G. Age:  38w 6d       > 99  %    FL/BPD:     76.5   %    71 - 87  FL:       69.4  mm     G. Age:  35w 4d         35  %    FL/AC:      19.8   %    20 - 24  Est. FW:    3284  gm      7 lb 4 oz     91  % ---------------------------------------------------------------------- OB History  Gravidity:    3         Term:   2        Prem:   0        SAB:   0  TOP:          0       Ectopic:  0        Living: 2 ---------------------------------------------------------------------- Gestational Age  LMP:           40w 4d        Date:  03/28/23                   EDD:   01/02/24  U/S Today:     37w 1d                                        EDD:   01/26/24  Best:          36w 0d     Det. By:  U/S C R L  (06/08/23)    EDD:   02/03/24 ---------------------------------------------------------------------- Anatomy  Cranium:               Appears normal         Aortic Arch:            Previously seen  Cavum:                 Previously seen        Ductal Arch:            Previously seen  Ventricles:            Previously seen        Diaphragm:              Appears normal  Choroid Plexus:        Previously seen        Stomach:                Appears normal, left  sided  Cerebellum:            Previously seen        Abdomen:                Previously seen  Posterior Fossa:       Previously seen        Abdominal Wall:         Previously seen  Face:                  Orbits and  profile     Cord Vessels:           Previously seen                         previously seen  Lips:                  Previously seen        Kidneys:                Appear normal  Thoracic:              Previously seen        Bladder:                Appears normal  Heart:                 Previously seen        Spine:                  Previously seen  RVOT:                  Previously seen        Upper Extremities:      Previously seen  LVOT:                  Previously seen        Lower Extremities:      Previously seen  Other:  Female gender previously seen. Fetal anatomic survey complete on          prior scans. ---------------------------------------------------------------------- Impression  G3 P2002 at 36-weeks' gestation.  Patient is here for fetal growth assessment.  On previous  ultrasound, the estimated fetal weight was at the 98th  percentile.  Patient does not have gestational diabetes.  Blood pressure  today at our office is 119/62 mmHg.  Her previous children weighed around 7 pounds at birth.  She  had 2 term vaginal deliveries.  Ultrasound  On today's ultrasound, the estimated fetal weight is at the  91st percentile and the abdominal circumference  measurement at the 99 percentile.  Amniotic fluid is normal  good fetal activity seen.  Cephalic presentation.  I reassured the patient of the findings.  I counseled the  patient that ultrasound has limitations in accurately estimating  fetal weights.  Given that she had 2 term vaginal deliveries, I  recommend vaginal delivery.  Patient is 5 feet 3 inches tall.  Patient reports her blood pressures have been higher at  home monitoring.  I discussed the normal parameters.  If she  has persistent systolic blood pressure reading of 140 or  higher and/or diastolic blood pressure of 90 mmHg or higher,  she will be presumed to have gestational hypertension.  We recommend delivery at [redacted] weeks gestation for  gestational hypertension/preeclampsia.  ---------------------------------------------------------------------- Recommendations  -No follow-up appointments were made. ----------------------------------------------------------------------  Deborah Fresh, MD Electronically Signed Final Report   01/06/2024 05:17 pm ----------------------------------------------------------------------    Assessment and Plan:  Pregnancy: G3P2002 at [redacted]w[redacted]d 1. Excessive fetal growth affecting pregnancy (Primary) 2. [redacted] weeks gestation of pregnancy 3. Supervision of other normal pregnancy, antepartum IOL at 39 weeks due to LGA[ 6/12 EFW 3284g/7-4 (91%)], scheduled on 01/27/24 daytime. Orders signed and held. Term labor symptoms and general obstetric precautions including but not limited to vaginal bleeding, contractions, leaking of fluid and fetal movement were reviewed in detail with the patient. Please refer to After Visit Summary for other counseling recommendations.   Return in about 1 week (around 02/01/2024) for OFFICE OB VISIT (MD only).  Future Appointments  Date Time Provider Department Center  03/17/2024  4:20 PM Tobb, Kardie, DO [facility] None  03/28/2024  4:15 PM Alm Delon SAILOR, DO [facility] None    Gloris Hugger, MD

## 2024-01-26 ENCOUNTER — Encounter (HOSPITAL_COMMUNITY): Payer: Self-pay | Admitting: Family Medicine

## 2024-01-26 DIAGNOSIS — Z3490 Encounter for supervision of normal pregnancy, unspecified, unspecified trimester: Principal | ICD-10-CM | POA: Diagnosis present

## 2024-01-26 NOTE — H&P (Signed)
 LABOR AND DELIVERY ADMISSION HISTORY AND PHYSICAL NOTE  Deborah Rice is a 28 y.o. female G12P2002 with IUP at [redacted]w[redacted]d presenting for IOL for LGA.   Patient reports the fetal movement as active. Patient reports uterine contraction activity as rare. Patient reports  vaginal bleeding as none. Patient describes fluid per vagina as None.   Patient denies headache, vision changes, chest pain, shortness of breath, right upper quadrant pain, or LE edema.  She plans on breast feeding. Her contraception plan is: {PLAN CONTRACEPTION:313102}.  Prenatal History/Complications: PNC at [facility]:  @[redacted]w[redacted]d , CWD, normal anatomy, cephalic presentation, anterior placenta, 91%ile/99% AC, EFW 3634  Pregnancy complications:  Patient Active Problem List   Diagnosis Date Noted   Encounter for induction of labor 01/26/2024   Group B Streptococcus carrier, +RV culture, currently pregnant 01/18/2024   LGA (large for gestational age) fetus affecting mother, antepartum 12/28/2023   Supervision of other normal pregnancy, antepartum 06/08/2023   Obesity affecting pregnancy 08/23/2018    Past Medical History: Past Medical History:  Diagnosis Date   Anxiety 04/06/2019   Asthma    Chlamydia 12/02/2021   Depression 04/06/2019   Low TSH level 09/10/2018   Normal 03/29/2019   Neg rpt at 28wks Normal fT4    Past Surgical History: Past Surgical History:  Procedure Laterality Date   DILATION AND EVACUATION N/A 03/11/2019   Procedure: DILATATION AND EVACUATION;  Surgeon: Alger Gong, MD;  Location: [facility] OR;  Service: Gynecology;  Laterality: N/A;   INSERTION OF IMPLANON  ROD  03/10/2022   REMOVAL OF IMPLANON  ROD  03/10/2022   REMOVAL OF IMPLANON  ROD  03/18/2022   WISDOM TOOTH EXTRACTION  2014    Obstetrical History: OB History     Gravida  3   Para  2   Term  2   Preterm      AB      Living  2      SAB      IAB      Ectopic      Multiple  0   Live Births  2           Social  History: Social History   Socioeconomic History   Marital status: Single    Spouse name: Not on file   Number of children: 2   Years of education: Not on file   Highest education level: Not on file  Occupational History   Not on file  Tobacco Use   Smoking status: Never   Smokeless tobacco: Never  Vaping Use   Vaping status: Former  Substance and Sexual Activity   Alcohol use: Not Currently    Alcohol/week: 1.0 standard drink of alcohol    Types: 1 Glasses of wine per week   Drug use: Not Currently   Sexual activity: Yes    Birth control/protection: None  Other Topics Concern   Not on file  Social History Narrative   Not on file   Social Drivers of Health   Financial Resource Strain: Not on file  Food Insecurity: Not on file  Transportation Needs: Not on file  Physical Activity: Not on file  Stress: Not on file  Social Connections: Not on file    Family History: Family History  Problem Relation Age of Onset   Hyperlipidemia Father    Asthma Sister    Asthma Brother    Cancer Maternal Aunt    Cancer Maternal Grandmother    Diabetes Maternal Grandmother     Allergies:  No Known Allergies  No medications prior to admission.     Review of Systems  All systems reviewed and negative except as stated in HPI  Physical Exam LMP 03/28/2023   Physical Exam Constitutional:      General: She is not in acute distress.    Appearance: She is not ill-appearing.  Cardiovascular:     Rate and Rhythm: Normal rate.  Pulmonary:     Effort: Pulmonary effort is normal.  Abdominal:     Comments: Gravid  Musculoskeletal:        General: No swelling.  Skin:    General: Skin is warm.  Neurological:     General: No focal deficit present.  Psychiatric:        Mood and Affect: Mood normal.     Presentation: cephalic by ***  Fetal monitoring: Baseline: *** bpm, Variability: {:31519}, Accelerations: {:31520}, and Decelerations: {FHR DECEL PRESENT:31526} Uterine  activity: {Uterine contractions:31516}     Prenatal labs: ABO, Rh: A/Positive/-- (12/11 1045) Antibody: Negative (12/11 1045) Rubella: 1.30 (12/11 1045) RPR: Non Reactive (04/22 0902)  HBsAg: Negative (12/11 1045)  HIV: Non Reactive (04/22 0902)  GC/Chlamydia:  Neisseria Gonorrhea  Date Value Ref Range Status  01/11/2024 Negative  Final   Chlamydia  Date Value Ref Range Status  01/11/2024 Negative  Final   GBS: Positive/-- (06/17 1354)   Prenatal Transfer Tool  Maternal Diabetes: No Genetic Screening: Normal Maternal Ultrasounds/Referrals: Normal Fetal Ultrasounds or other Referrals:  None Maternal Substance Abuse:  No Significant Maternal Medications:  None Significant Maternal Lab Results: Group B Strep positive  No results found for this or any previous visit (from the past 24 hours).  Assessment: Deborah Rice is a 28 y.o. G3P2002 at [redacted]w[redacted]d here for IOL for LGA.  #Labor: *** #Pain: IV pain meds PRN, epidural upon request*** #FHT: {FHR Tracing Category:28603} #GBS/ID: Positive > PCN #MOF: breast feeding #MOC: {PLAN CONTRACEPTION:313102}  #LGA: EFW ~3600 g. Pelvis proven to 3565 g. No diabetes. Shoulder precautions at delivery  [name], MD [facility] for [facility], [facility]  01/26/2024, 10:59 PM

## 2024-01-27 ENCOUNTER — Inpatient Hospital Stay (HOSPITAL_COMMUNITY)

## 2024-01-27 ENCOUNTER — Inpatient Hospital Stay (HOSPITAL_COMMUNITY)
Admission: RE | Admit: 2024-01-27 | Discharge: 2024-01-29 | DRG: 806 | Disposition: A | Discharge status: Home / Self Care | Attending: Family Medicine | Admitting: Family Medicine

## 2024-01-27 ENCOUNTER — Encounter (HOSPITAL_COMMUNITY): Payer: Self-pay | Admitting: Anesthesiology

## 2024-01-27 ENCOUNTER — Encounter (HOSPITAL_COMMUNITY): Payer: Self-pay | Admitting: Obstetrics & Gynecology

## 2024-01-27 DIAGNOSIS — O9902 Anemia complicating childbirth: Secondary | ICD-10-CM | POA: Diagnosis not present

## 2024-01-27 DIAGNOSIS — Z30017 Encounter for initial prescription of implantable subdermal contraceptive: Secondary | ICD-10-CM

## 2024-01-27 DIAGNOSIS — O99824 Streptococcus B carrier state complicating childbirth: Secondary | ICD-10-CM | POA: Diagnosis present

## 2024-01-27 DIAGNOSIS — O3663X0 Maternal care for excessive fetal growth, third trimester, not applicable or unspecified: Principal | ICD-10-CM | POA: Diagnosis present

## 2024-01-27 DIAGNOSIS — O99820 Streptococcus B carrier state complicating pregnancy: Secondary | ICD-10-CM

## 2024-01-27 DIAGNOSIS — Z3490 Encounter for supervision of normal pregnancy, unspecified, unspecified trimester: Principal | ICD-10-CM | POA: Diagnosis present

## 2024-01-27 DIAGNOSIS — O99214 Obesity complicating childbirth: Secondary | ICD-10-CM | POA: Diagnosis not present

## 2024-01-27 DIAGNOSIS — O99210 Obesity complicating pregnancy, unspecified trimester: Secondary | ICD-10-CM | POA: Diagnosis present

## 2024-01-27 DIAGNOSIS — Z833 Family history of diabetes mellitus: Secondary | ICD-10-CM | POA: Diagnosis not present

## 2024-01-27 DIAGNOSIS — O3660X0 Maternal care for excessive fetal growth, unspecified trimester, not applicable or unspecified: Secondary | ICD-10-CM | POA: Diagnosis present

## 2024-01-27 DIAGNOSIS — Z3480 Encounter for supervision of other normal pregnancy, unspecified trimester: Secondary | ICD-10-CM

## 2024-01-27 DIAGNOSIS — O3663X9 Maternal care for excessive fetal growth, third trimester, other fetus: Secondary | ICD-10-CM | POA: Diagnosis present

## 2024-01-27 DIAGNOSIS — Z975 Presence of (intrauterine) contraceptive device: Secondary | ICD-10-CM

## 2024-01-27 DIAGNOSIS — Z3A39 39 weeks gestation of pregnancy: Secondary | ICD-10-CM

## 2024-01-27 DIAGNOSIS — D62 Acute posthemorrhagic anemia: Secondary | ICD-10-CM | POA: Diagnosis not present

## 2024-01-27 LAB — CBC
HCT: 38.2 % (ref 36.0–46.0)
Hemoglobin: 12.7 g/dL (ref 12.0–15.0)
MCH: 29 pg (ref 26.0–34.0)
MCHC: 33.2 g/dL (ref 30.0–36.0)
MCV: 87.2 fL (ref 80.0–100.0)
Platelets: 193 10*3/uL (ref 150–400)
RBC: 4.38 MIL/uL (ref 3.87–5.11)
RDW: 14.2 % (ref 11.5–15.5)
WBC: 10.9 10*3/uL — ABNORMAL HIGH (ref 4.0–10.5)
nRBC: 0 % (ref 0.0–0.2)

## 2024-01-27 LAB — TYPE AND SCREEN
ABO/RH(D): A POS
Antibody Screen: NEGATIVE

## 2024-01-27 LAB — RPR: RPR Ser Ql: NONREACTIVE

## 2024-01-27 MED ORDER — SOD CITRATE-CITRIC ACID 500-334 MG/5ML PO SOLN: 30.0000 mL | ORAL | Status: DC | PRN

## 2024-01-27 MED ORDER — ZOLPIDEM TARTRATE 5 MG PO TABS: 5.0000 mg | ORAL_TABLET | Freq: Every evening | ORAL | Status: DC | PRN

## 2024-01-27 MED ORDER — TERBUTALINE SULFATE 1 MG/ML IJ SOLN: 0.2500 mg | Freq: Once | INTRAMUSCULAR | Status: DC | PRN

## 2024-01-27 MED ORDER — PENICILLIN G POT IN DEXTROSE 60000 UNIT/ML IV SOLN
3.0000 10*6.[IU] | INTRAVENOUS | Status: DC
  Administered 2024-01-27: 3 10*6.[IU] via INTRAVENOUS
  Filled 2024-01-27 (×3): qty 50

## 2024-01-27 MED ORDER — OXYCODONE-ACETAMINOPHEN 5-325 MG PO TABS: 2.0000 | ORAL_TABLET | ORAL | Status: DC | PRN

## 2024-01-27 MED ORDER — OXYTOCIN-SODIUM CHLORIDE 30-0.9 UT/500ML-% IV SOLN
2.5000 [IU]/h | INTRAVENOUS | Status: DC
  Filled 2024-01-27: qty 500

## 2024-01-27 MED ORDER — SENNOSIDES-DOCUSATE SODIUM 8.6-50 MG PO TABS
2.0000 | ORAL_TABLET | Freq: Every day | ORAL | Status: DC
Start: 2024-01-28 — End: 2024-01-29
  Administered 2024-01-28: 2 via ORAL
  Filled 2024-01-27: qty 2

## 2024-01-27 MED ORDER — PHENYLEPHRINE 80 MCG/ML (10ML) SYRINGE FOR IV PUSH (FOR BLOOD PRESSURE SUPPORT)
80.0000 ug | PREFILLED_SYRINGE | INTRAVENOUS | Status: DC | PRN
  Filled 2024-01-27: qty 10

## 2024-01-27 MED ORDER — OXYTOCIN-SODIUM CHLORIDE 30-0.9 UT/500ML-% IV SOLN: 1.0000 m[IU]/min | INTRAVENOUS | Status: DC

## 2024-01-27 MED ORDER — COCONUT OIL OIL: 1.0000 | TOPICAL_OIL | Status: DC | PRN

## 2024-01-27 MED ORDER — LACTATED RINGERS IV SOLN: INTRAVENOUS | Status: DC

## 2024-01-27 MED ORDER — SIMETHICONE 80 MG PO CHEW: 80.0000 mg | CHEWABLE_TABLET | ORAL | Status: DC | PRN

## 2024-01-27 MED ORDER — ACETAMINOPHEN 325 MG PO TABS: 650.0000 mg | ORAL_TABLET | ORAL | Status: DC | PRN

## 2024-01-27 MED ORDER — EPHEDRINE 5 MG/ML INJ: 10.0000 mg | INTRAVENOUS | Status: DC | PRN

## 2024-01-27 MED ORDER — LACTATED RINGERS IV SOLN: 500.0000 mL | INTRAVENOUS | Status: DC | PRN

## 2024-01-27 MED ORDER — FLEET ENEMA RE ENEM: 1.0000 | ENEMA | Freq: Every day | RECTAL | Status: DC | PRN

## 2024-01-27 MED ORDER — DIPHENHYDRAMINE HCL 50 MG/ML IJ SOLN: 12.5000 mg | INTRAMUSCULAR | Status: DC | PRN

## 2024-01-27 MED ORDER — OXYCODONE-ACETAMINOPHEN 5-325 MG PO TABS: 1.0000 | ORAL_TABLET | ORAL | Status: DC | PRN

## 2024-01-27 MED ORDER — MISOPROSTOL 50MCG HALF TABLET
50.0000 ug | ORAL_TABLET | Freq: Once | ORAL | Status: AC
  Administered 2024-01-27: 50 ug via ORAL
  Filled 2024-01-27: qty 1

## 2024-01-27 MED ORDER — MISOPROSTOL 25 MCG QUARTER TABLET: 25.0000 ug | ORAL_TABLET | Freq: Once | ORAL | Status: DC

## 2024-01-27 MED ORDER — MEDROXYPROGESTERONE ACETATE 150 MG/ML IM SUSP: 150.0000 mg | INTRAMUSCULAR | Status: DC | PRN

## 2024-01-27 MED ORDER — IBUPROFEN 800 MG PO TABS
800.0000 mg | ORAL_TABLET | Freq: Three times a day (TID) | ORAL | Status: DC
  Administered 2024-01-27 – 2024-01-29 (×7): 800 mg via ORAL
  Filled 2024-01-27 (×7): qty 1

## 2024-01-27 MED ORDER — ONDANSETRON HCL 4 MG PO TABS: 4.0000 mg | ORAL_TABLET | ORAL | Status: DC | PRN

## 2024-01-27 MED ORDER — PHENYLEPHRINE 80 MCG/ML (10ML) SYRINGE FOR IV PUSH (FOR BLOOD PRESSURE SUPPORT): 80.0000 ug | PREFILLED_SYRINGE | INTRAVENOUS | Status: DC | PRN

## 2024-01-27 MED ORDER — OXYTOCIN BOLUS FROM INFUSION
333.0000 mL | Freq: Once | INTRAVENOUS | Status: AC
  Administered 2024-01-27: 333 mL via INTRAVENOUS

## 2024-01-27 MED ORDER — ONDANSETRON HCL 4 MG/2ML IJ SOLN: 4.0000 mg | INTRAMUSCULAR | Status: DC | PRN

## 2024-01-27 MED ORDER — PRENATAL MULTIVITAMIN CH
1.0000 | ORAL_TABLET | Freq: Every day | ORAL | Status: DC
  Administered 2024-01-27 – 2024-01-28 (×2): 1 via ORAL
  Filled 2024-01-27 (×2): qty 1

## 2024-01-27 MED ORDER — ACETAMINOPHEN 500 MG PO TABS
1000.0000 mg | ORAL_TABLET | Freq: Three times a day (TID) | ORAL | Status: DC
  Administered 2024-01-27 – 2024-01-29 (×6): 1000 mg via ORAL
  Filled 2024-01-27 (×6): qty 2

## 2024-01-27 MED ORDER — SODIUM CHLORIDE 0.9 % IV SOLN
5.0000 10*6.[IU] | Freq: Once | INTRAVENOUS | Status: AC
  Administered 2024-01-27: 5 10*6.[IU] via INTRAVENOUS
  Filled 2024-01-27: qty 5

## 2024-01-27 MED ORDER — DIPHENHYDRAMINE HCL 25 MG PO CAPS: 25.0000 mg | ORAL_CAPSULE | Freq: Four times a day (QID) | ORAL | Status: DC | PRN

## 2024-01-27 MED ORDER — LACTATED RINGERS IV SOLN: 500.0000 mL | Freq: Once | INTRAVENOUS | Status: DC

## 2024-01-27 MED ORDER — DIBUCAINE (PERIANAL) 1 % EX OINT: 1.0000 | TOPICAL_OINTMENT | CUTANEOUS | Status: DC | PRN

## 2024-01-27 MED ORDER — BENZOCAINE-MENTHOL 20-0.5 % EX AERO: 1.0000 | INHALATION_SPRAY | CUTANEOUS | Status: DC | PRN

## 2024-01-27 MED ORDER — HYDROXYZINE HCL 25 MG PO TABS: 50.0000 mg | ORAL_TABLET | Freq: Four times a day (QID) | ORAL | Status: DC | PRN

## 2024-01-27 MED ORDER — OXYCODONE HCL 5 MG PO TABS: 5.0000 mg | ORAL_TABLET | Freq: Four times a day (QID) | ORAL | Status: DC | PRN

## 2024-01-27 MED ORDER — FENTANYL CITRATE (PF) 100 MCG/2ML IJ SOLN
50.0000 ug | INTRAMUSCULAR | Status: DC | PRN
  Administered 2024-01-27 (×2): 100 ug via INTRAVENOUS
  Filled 2024-01-27 (×2): qty 2

## 2024-01-27 MED ORDER — LIDOCAINE HCL (PF) 1 % IJ SOLN: 30.0000 mL | INTRAMUSCULAR | Status: DC | PRN

## 2024-01-27 MED ORDER — MISOPROSTOL 25 MCG QUARTER TABLET
25.0000 ug | ORAL_TABLET | Freq: Once | ORAL | Status: AC
  Administered 2024-01-27: 25 ug via VAGINAL
  Filled 2024-01-27: qty 1

## 2024-01-27 MED ORDER — FENTANYL-BUPIVACAINE-NACL 0.5-0.125-0.9 MG/250ML-% EP SOLN
12.0000 mL/h | EPIDURAL | Status: DC | PRN
  Filled 2024-01-27: qty 250

## 2024-01-27 MED ORDER — WITCH HAZEL-GLYCERIN EX PADS: 1.0000 | MEDICATED_PAD | CUTANEOUS | Status: DC | PRN

## 2024-01-27 MED ORDER — MISOPROSTOL 50MCG HALF TABLET: 50.0000 ug | ORAL_TABLET | Freq: Once | ORAL | Status: DC

## 2024-01-27 MED ORDER — OXYCODONE HCL 5 MG PO TABS
10.0000 mg | ORAL_TABLET | Freq: Four times a day (QID) | ORAL | Status: DC | PRN
  Administered 2024-01-28: 10 mg via ORAL
  Filled 2024-01-27: qty 2

## 2024-01-27 MED ORDER — ONDANSETRON HCL 4 MG/2ML IJ SOLN: 4.0000 mg | Freq: Four times a day (QID) | INTRAMUSCULAR | Status: DC | PRN

## 2024-01-27 NOTE — Lactation Note (Signed)
 This note was copied from a baby's chart. Lactation Consultation Note  Patient Name: Deborah Rice Date: 01/27/2024 Age:29 hours  Reason for consult: Initial assessment;Term  P3, [redacted]w[redacted]d  Initial LC visit with P3 mother and her baby Deborah Deborah Rice. Mother reports having very limited breastfeeding experience with her previous babies. She indicates that she had milk production after her deliveries. Mother says that this baby has been breastfeeding very well so far.   Mother encouraged to latch baby with feeding cues, place baby skin to skin if not latching, and call for assistance with breastfeeding as needed. Baby may need to be awakened with diaper changes if not showing cues. Discussed to anticipate as baby approaches 24 hours of age, baby should breastfeed more often, 8-12 plus times in 24 hours, and cluster feedings.     Mom made aware of O/P services, breastfeeding support groups, community resources, and our phone # for post-discharge questions.     Maternal Data Has patient been taught Hand Expression?: No Does the patient have breastfeeding experience prior to this delivery?: Yes (attempted to breastfeed for 1 week with first baby) How long did the patient breastfeed?: for a week with her first baby, none with 2nd  Feeding Mother's Current Feeding Choice: Breast Milk and Formula   Interventions Interventions: Breast feeding basics reviewed;Education;LC Services brochure;CDC milk storage guidelines;CDC Guidelines for Breast Pump Cleaning  Discharge Pump: Hands Free (Mom Cozy)  Consult Status Consult Status: Follow-up Date: 01/28/24 Follow-up type: In-patient    Joshua Line M 01/27/2024, 5:42 PM

## 2024-01-27 NOTE — Discharge Summary (Cosign Needed)
 Postpartum Discharge Summary  Date of Service updated 01/29/2024     Patient Name: Deborah Rice DOB: [birth year] MRN: [id number]  Date of admission: 01/27/2024 Delivery date:01/27/2024 Delivering provider: NICHOLAUS ALMARIE HERO Date of discharge: 01/29/2024  Admitting diagnosis: LGA (large for gestational age) fetus affecting management of mother, third trimester, other fetus [O36.63X9] Intrauterine pregnancy: [redacted]w[redacted]d     Secondary diagnosis:  Principal Problem:   SVD (spontaneous vaginal delivery) Active Problems:   Obesity affecting pregnancy   Supervision of other normal pregnancy, antepartum   LGA (large for gestational age) fetus affecting mother, antepartum   Group B Streptococcus carrier, +RV culture, currently pregnant   LGA (large for gestational age) fetus affecting management of mother, third trimester, other fetus   Nexplanon  in place  Additional problems: Nexplanon  insertion    Discharge diagnosis: Term Pregnancy Delivered                                              Post partum procedures:None Augmentation: AROM, Cytotec , and IP Foley Complications: None  Hospital course: Induction of Labor With Vaginal Delivery   28 y.o. yo G3P3003 at [redacted]w[redacted]d was admitted to the hospital 01/27/2024 for induction of labor.  Indication for induction: LGA.  Patient had an labor course complicated by none. Membrane Rupture Time/Date: 4:41 AM,01/27/2024  Delivery Method:Vaginal, Spontaneous Operative Delivery:N/A Episiotomy: None Lacerations:  None Details of delivery can be found in separate delivery note.  Patient did require manual sweep postpartum, pt given Methergine  series.  Lochia remained appropriate, pt asymptomatic and Hgb stable. Patient is discharged home 01/29/24.  Newborn Data: Birth date:01/27/2024 Birth time:7:38 AM Gender:Female Living status:Living Apgars:7 ,8  Weight:3730 g  Magnesium  Sulfate received: No BMZ received: No Rhophylac:No MMR: No T-DaP: Given  prenatally Flu: No RSV Vaccine received: No Transfusion:No  Immunizations received: Immunization History  Administered Date(s) Administered   Influenza,inj,Quad PF,6+ Mos 05/06/2020, 05/01/2022   Influenza-Unspecified 09/05/2019, 05/20/2021, 05/11/2023   PFIZER(Purple Top)SARS-COV-2 Vaccination 12/19/2019, 01/11/2020   PPD Test 03/25/2020, 04/05/2020, 04/08/2022, 09/22/2023, 09/29/2023   Tdap 12/13/2018, 03/28/2020, 11/16/2023    Physical exam  Vitals:   01/27/24 2010 01/28/24 0500 01/28/24 1925 01/29/24 0553  BP: 118/66 120/60 122/83 119/77  Pulse: 77 74 79 74  Resp: 16 16 16 17   Temp: 98.2 F (36.8 C) 98.3 F (36.8 C) 98.2 F (36.8 C) 97.6 F (36.4 C)  TempSrc: Oral Oral Oral Oral  SpO2: 100% 100% 99% 100%  Weight:      Height:       General: alert, cooperative, and no distress Lochia: appropriate CV: RRR Lungs: CTAB Uterine Fundus: firm Incision: N/A DVT Evaluation: No evidence of DVT seen on physical exam. Negative Homan's sign. No cords or calf tenderness. Labs: Lab Results  Component Value Date   WBC 10.7 (H) 01/29/2024   HGB 10.1 (L) 01/29/2024   HCT 30.4 (L) 01/29/2024   MCV 89.1 01/29/2024   PLT 184 01/29/2024      Latest Ref Rng & Units 01/29/2024    4:21 AM  CMP  Glucose 70 - 99 mg/dL 883   BUN 6 - 20 mg/dL 7   Creatinine 9.55 - 8.99 mg/dL 9.31   Sodium 864 - 854 mmol/L 136   Potassium 3.5 - 5.1 mmol/L 3.4   Chloride 98 - 111 mmol/L 105   CO2 22 - 32 mmol/L 21  Calcium 8.9 - 10.3 mg/dL 9.2   Total Protein 6.5 - 8.1 g/dL 5.5   Total Bilirubin 0.0 - 1.2 mg/dL 0.2   Alkaline Phos 38 - 126 U/L 59   AST 15 - 41 U/L 19   ALT 0 - 44 U/L 13    Edinburgh Score:    01/28/2024   12:00 PM  Edinburgh Postnatal Depression Scale Screening Tool  I have been able to laugh and see the funny side of things. 0  I have looked forward with enjoyment to things. 1  I have blamed myself unnecessarily when things went wrong. 1  I have been anxious or worried  for no good reason. 0  I have felt scared or panicky for no good reason. 0  Things have been getting on top of me. 1  I have been so unhappy that I have had difficulty sleeping. 0  I have felt sad or miserable. 1  I have been so unhappy that I have been crying. 0  The thought of harming myself has occurred to me. 0  Edinburgh Postnatal Depression Scale Total 4   Edinburgh Postnatal Depression Scale Total: 4   After visit meds:  Allergies as of 01/29/2024   No Known Allergies      Medication List     TAKE these medications    acetaminophen  500 MG tablet Commonly known as: TYLENOL  Take 500 mg by mouth every 6 (six) hours as needed.   coconut oil Oil Apply 1 Application topically as needed.   cyclobenzaprine  10 MG tablet Commonly known as: FLEXERIL  Take 1 tablet (10 mg total) by mouth 2 (two) times daily as needed for muscle spasms.   ferrous gluconate  324 MG tablet Commonly known as: FERGON Take 1 tablet (324 mg total) by mouth every other day.   ibuprofen  800 MG tablet Commonly known as: ADVIL  Take 1 tablet (800 mg total) by mouth every 8 (eight) hours.   prenatal multivitamin Tabs tablet Take 1 tablet by mouth daily at 12 noon.   witch hazel-glycerin  pad Commonly known as: TUCKS Apply 1 Application topically as needed for hemorrhoids.         Discharge home in stable condition Infant Feeding: Breast Infant Disposition:home with mother Discharge instruction: per After Visit Summary and Postpartum booklet. Activity: Advance as tolerated. Pelvic rest for 6 weeks.  Diet: routine diet Future Appointments: Future Appointments  Date Time Provider Department Center  03/09/2024 10:55 AM Fredirick Glenys RAMAN, MD [facility]  03/17/2024  4:20 PM Sheena Pugh, DO [facility] None  03/28/2024  4:15 PM Alm Delon SAILOR, DO [facility] None   Follow up Visit:  Follow-up Information     [facility] for [facility] at [facility]. Schedule an appointment as  soon as possible for a visit.   Specialty: Obstetrics and Gynecology Why: Routine postpartum follow up in 4-6 weeks Contact information: [address] [place]  [phone]                Message sent to [facility] 7/3  Please schedule this patient for a In person postpartum visit in 6 weeks with the following provider: Any provider. Additional Postpartum F/U:None  Low risk pregnancy complicated by: LGA Delivery mode:  Vaginal, Spontaneous Anticipated Birth Control:  Nexplanon    01/29/2024 Jennifer M Ozan, DO

## 2024-01-27 NOTE — Discharge Summary (Incomplete Revision)
 Postpartum Discharge Summary  Date of Service updated 01/29/2024     Patient Name: Deborah Rice DOB: 02-09-1996 MRN: [id number]  Date of admission: 01/27/2024 Delivery date:01/27/2024 Delivering provider: NICHOLAUS ALMARIE HERO Date of discharge: 01/28/2024  Admitting diagnosis: LGA (large for gestational age) fetus affecting management of mother, third trimester, other fetus [O36.63X9] Intrauterine pregnancy: [redacted]w[redacted]d     Secondary diagnosis:  Principal Problem:   SVD (spontaneous vaginal delivery) Active Problems:   Obesity affecting pregnancy   Supervision of other normal pregnancy, antepartum   LGA (large for gestational age) fetus affecting mother, antepartum   Group B Streptococcus carrier, +RV culture, currently pregnant   LGA (large for gestational age) fetus affecting management of mother, third trimester, other fetus   Nexplanon  in place  Additional problems: Nexplanon  insertion    Discharge diagnosis: Term Pregnancy Delivered                                              Post partum procedures:None Augmentation: AROM, Cytotec , and IP Foley Complications: None  Hospital course: Induction of Labor With Vaginal Delivery   28 y.o. yo G3P3003 at [redacted]w[redacted]d was admitted to the hospital 01/27/2024 for induction of labor.  Indication for induction: LGA.  Patient had an labor course complicated by none. Membrane Rupture Time/Date: 4:41 AM,01/27/2024  Delivery Method:Vaginal, Spontaneous Operative Delivery:N/A Episiotomy: None Lacerations:  None Details of delivery can be found in separate delivery note.  Patient had an uncomplicated postpartum course. Patient is discharged home 01/28/24.  Newborn Data: Birth date:01/27/2024 Birth time:7:38 AM Gender:Female Living status:Living Apgars:7 ,8  Weight:3730 g  Magnesium  Sulfate received: No BMZ received: No Rhophylac:No MMR: No T-DaP: Given prenatally Flu: No RSV Vaccine received: No Transfusion:No  Immunizations  received: Immunization History  Administered Date(s) Administered   Influenza,inj,Quad PF,6+ Mos 05/06/2020, 05/01/2022   Influenza-Unspecified 09/05/2019, 05/20/2021, 05/11/2023   PFIZER(Purple Top)SARS-COV-2 Vaccination 12/19/2019, 01/11/2020   PPD Test 03/25/2020, 04/05/2020, 04/08/2022, 09/22/2023, 09/29/2023   Tdap 12/13/2018, 03/28/2020, 11/16/2023    Physical exam  Vitals:   01/27/24 1534 01/27/24 1822 01/27/24 2010 01/28/24 0500  BP: (!) 111/45 120/65 118/66 120/60  Pulse: 81 89 77 74  Resp: 18 17 16 16   Temp: 98.5 F (36.9 C) 98.4 F (36.9 C) 98.2 F (36.8 C) 98.3 F (36.8 C)  TempSrc: Oral Oral Oral Oral  SpO2: 98% 99% 100% 100%  Weight:      Height:       General: alert, cooperative, and no distress Lochia: appropriate Uterine Fundus: firm Incision: N/A DVT Evaluation: No evidence of DVT seen on physical exam. Negative Homan's sign. No cords or calf tenderness. Labs: Lab Results  Component Value Date   WBC 10.9 (H) 01/27/2024   HGB 12.7 01/27/2024   HCT 38.2 01/27/2024   MCV 87.2 01/27/2024   PLT 193 01/27/2024      Latest Ref Rng & Units 01/01/2024    4:17 PM  CMP  Glucose 70 - 99 mg/dL 879   BUN 6 - 20 mg/dL 7   Creatinine 9.55 - 8.99 mg/dL 9.34   Sodium 864 - 854 mmol/L 135   Potassium 3.5 - 5.1 mmol/L 3.4   Chloride 98 - 111 mmol/L 105   CO2 22 - 32 mmol/L 19   Calcium 8.9 - 10.3 mg/dL 9.5   Total Protein 6.5 - 8.1 g/dL 6.5   Total  Bilirubin 0.0 - 1.2 mg/dL 0.5   Alkaline Phos 38 - 126 U/L 80   AST 15 - 41 U/L 23   ALT 0 - 44 U/L 14    Edinburgh Score:    03/29/2019    1:21 PM  Edinburgh Postnatal Depression Scale Screening Tool  I have been able to laugh and see the funny side of things. 0  I have looked forward with enjoyment to things. 1  I have blamed myself unnecessarily when things went wrong. 2  I have been anxious or worried for no good reason. 2  I have felt scared or panicky for no good reason. 1  Things have been getting on  top of me. 2  I have been so unhappy that I have had difficulty sleeping. 1  I have felt sad or miserable. 1  I have been so unhappy that I have been crying. 2  The thought of harming myself has occurred to me. 0  Edinburgh Postnatal Depression Scale Total 12      Data saved with a previous flowsheet row definition   No data recorded  After visit meds:  Allergies as of 01/28/2024   No Known Allergies      Medication List     TAKE these medications    acetaminophen  500 MG tablet Commonly known as: TYLENOL  Take 500 mg by mouth every 6 (six) hours as needed.   coconut oil Oil Apply 1 Application topically as needed.   cyclobenzaprine  10 MG tablet Commonly known as: FLEXERIL  Take 1 tablet (10 mg total) by mouth 2 (two) times daily as needed for muscle spasms.   ibuprofen  800 MG tablet Commonly known as: ADVIL  Take 1 tablet (800 mg total) by mouth every 8 (eight) hours.   prenatal multivitamin Tabs tablet Take 1 tablet by mouth daily at 12 noon.   witch hazel-glycerin  pad Commonly known as: TUCKS Apply 1 Application topically as needed for hemorrhoids.         Discharge home in stable condition Infant Feeding: Breast Infant Disposition:home with mother Discharge instruction: per After Visit Summary and Postpartum booklet. Activity: Advance as tolerated. Pelvic rest for 6 weeks.  Diet: routine diet Future Appointments: Future Appointments  Date Time Provider Department Center  03/09/2024 10:55 AM Fredirick Glenys RAMAN, MD [facility]  03/17/2024  4:20 PM Sheena Pugh, DO [facility] None  03/28/2024  4:15 PM Alm Delon SAILOR, DO [facility] None   Follow up Visit:  Follow-up Information     [facility] at [facility]. Schedule an appointment as soon as possible for a visit.   Specialty: Obstetrics and Gynecology Why: Routine postpartum follow up in 4-6 weeks Contact information: [address] [place]   [id number] [phone]                Message sent to [facility] 7/3  Please schedule this patient for a In person postpartum visit in 6 weeks with the following provider: Any provider. Additional Postpartum F/U:None  Low risk pregnancy complicated by: LGA Delivery mode:  Vaginal, Spontaneous Anticipated Birth Control:  Unsure - thinking Nexplanon    01/28/2024 Mardy Shropshire, MD

## 2024-01-27 NOTE — Anesthesia Preprocedure Evaluation (Signed)
 Anesthesia Evaluation  Patient identified by MRN, date of birth, ID band Patient awake    Reviewed: Allergy & Precautions, H&P , NPO status , Patient's Chart, lab work & pertinent test results  Airway Mallampati: II  TM Distance: >3 FB Neck ROM: Full    Dental no notable dental hx. (+) Teeth Intact, Dental Advisory Given   Pulmonary asthma    Pulmonary exam normal breath sounds clear to auscultation       Cardiovascular negative cardio ROS  Rhythm:Regular Rate:Normal     Neuro/Psych   Anxiety Depression    negative neurological ROS  negative psych ROS   GI/Hepatic negative GI ROS, Neg liver ROS,,,  Endo/Other  negative endocrine ROS    Renal/GU negative Renal ROS  negative genitourinary   Musculoskeletal   Abdominal  (+) + obese  Peds  Hematology negative hematology ROS (+)   Anesthesia Other Findings   Reproductive/Obstetrics (+) Pregnancy                              Anesthesia Physical Anesthesia Plan  ASA: II  Anesthesia Plan: Epidural   Post-op Pain Management:    Induction: Intravenous  PONV Risk Score and Plan: Treatment may vary due to age or medical condition  Airway Management Planned: LMA and Natural Airway  Additional Equipment:   Intra-op Plan:   Post-operative Plan:   Informed Consent: I have reviewed the patients History and Physical, chart, labs and discussed the procedure including the risks, benefits and alternatives for the proposed anesthesia with the patient or authorized representative who has indicated his/her understanding and acceptance.       Plan Discussed with:   Anesthesia Plan Comments:          Anesthesia Quick Evaluation

## 2024-01-27 NOTE — Progress Notes (Signed)
 LABOR PROGRESS NOTE  Patient Name: Deborah Rice, female   DOB: [birth year], 28 y.o.  MRN: [id number]  Patient starting to feel contractions. Q2-5. Amenable to check. Cervix now 3/60/-2. Discussed option for [facility] or AROM. She desires balloon at this time. Placed without difficulty. Plan for AROM once out. Cat I.  [name], MD

## 2024-01-27 NOTE — Progress Notes (Signed)
 LABOR PROGRESS NOTE  Patient Name: Deborah Rice, female   DOB: 1995/09/14, 28 y.o.  MRN: [id number]  Bluford out. Swaying and moving with contractions. Amenable to AROM. Cervix 5.5/80/-2. R/B/A of AROM discussed with patient, and verbal consent obtained. Babe's head found to be well-applied. Obtained small clear fluid with bloody show. Mom and babe tolerated well. Cat I. Anticipate SVD soon.  [name], MD

## 2024-01-28 ENCOUNTER — Other Ambulatory Visit: Payer: Self-pay

## 2024-01-28 DIAGNOSIS — Z975 Presence of (intrauterine) contraceptive device: Secondary | ICD-10-CM

## 2024-01-28 DIAGNOSIS — Z30017 Encounter for initial prescription of implantable subdermal contraceptive: Secondary | ICD-10-CM

## 2024-01-28 LAB — BIRTH TISSUE RECOVERY COLLECTION (PLACENTA DONATION)

## 2024-01-28 MED ORDER — ETONOGESTREL 68 MG ~~LOC~~ IMPL
68.0000 mg | DRUG_IMPLANT | Freq: Once | SUBCUTANEOUS | Status: AC
  Administered 2024-01-28: 68 mg via SUBCUTANEOUS
  Filled 2024-01-28: qty 1

## 2024-01-28 MED ORDER — COCONUT OIL OIL: 1.0000 | TOPICAL_OIL | Status: AC | PRN

## 2024-01-28 MED ORDER — WITCH HAZEL-GLYCERIN EX PADS: 1.0000 | MEDICATED_PAD | CUTANEOUS | Status: AC | PRN

## 2024-01-28 MED ORDER — METHYLERGONOVINE MALEATE 0.2 MG PO TABS: 0.2000 mg | ORAL_TABLET | ORAL | Status: DC

## 2024-01-28 MED ORDER — LIDOCAINE HCL 1 % IJ SOLN
0.0000 mL | Freq: Once | INTRAMUSCULAR | Status: AC | PRN
  Administered 2024-01-28: 20 mL via INTRADERMAL
  Filled 2024-01-28: qty 20

## 2024-01-28 MED ORDER — IBUPROFEN 800 MG PO TABS: 800.0000 mg | ORAL_TABLET | Freq: Three times a day (TID) | ORAL | 0 refills | Status: AC

## 2024-01-28 MED ORDER — METHYLERGONOVINE MALEATE 0.2 MG PO TABS
0.2000 mg | ORAL_TABLET | ORAL | Status: DC
  Administered 2024-01-28 – 2024-01-29 (×3): 0.2 mg via ORAL
  Filled 2024-01-28 (×3): qty 1

## 2024-01-28 NOTE — Progress Notes (Signed)
 POSTPARTUM PROGRESS NOTE  Post Partum Day 1  Subjective:  Deborah Rice is a 28 y.o. H6E6996 s/p SVD at [redacted]w[redacted]d.  She reports she is doing well. No acute events overnight. She denies any problems with ambulating, voiding or po intake. Denies nausea or vomiting.  Pain is moderately controlled.  Lochia is appropriate.  Objective: Blood pressure 120/60, pulse 74, temperature 98.3 F (36.8 C), temperature source Oral, resp. rate 16, height 5' 4 (1.626 m), weight 102.1 kg, last menstrual period 03/28/2023, SpO2 100%, unknown if currently breastfeeding.  Physical Exam:  General: alert, cooperative and no distress Chest: no respiratory distress Heart:regular rate, distal pulses intact Uterine Fundus: firm, appropriately tender DVT Evaluation: No calf swelling or tenderness Extremities: trace edema Skin: warm, dry  Recent Labs    01/27/24 0033  HGB 12.7  HCT 38.2    Assessment/Plan: Deborah Rice is a 28 y.o. H6E6996 s/p SVD at [redacted]w[redacted]d   PPD#1 - Doing well  Routine postpartum care  Contraception: unsure, possible Nexplanon  Feeding: Breast Dispo: Plan for discharge 7/5 due to baby delay.   LOS: 1 day   Chanin Frumkin, MD OB Fellow  01/28/2024, 1:55 PM

## 2024-01-28 NOTE — Patient Instructions (Signed)
 If interested in an outpatient lactation consult in office or virtually please reach out to us  at [facility] (First Floor) [address] [place]  Please call [phone] and press 4 for lactation.   --  Lactation support groups:  [facility], Tuesdays 10:00 am -12:00 pm at [address] on the second floor in the conference room, lactating parents and lap babies welcome.  [email or web address]  [email or web address]

## 2024-01-28 NOTE — Lactation Note (Signed)
 This note was copied from a baby's chart. Lactation Consultation Note  Patient Name: Deborah Rice Unijb'd Date: 01/28/2024 Age:29 hours, P3  Reason for consult: Follow-up assessment;Infant weight loss;Term Baby latched when [facility] entered the room , see below for assessment.  LC reviewed BF D/C teaching and the [facility] resources.  LC provided the hand pump with flanges sizes , Per mom has been measured at 17 , and aware the Medela is lowest is 2  Mom aware of the [facility] resources.   Maternal Data Has patient been taught Hand Expression?: Yes Does the patient have breastfeeding experience prior to this delivery?: Yes  Feeding Mother's Current Feeding Choice: Breast Milk and Formula  LATCH Score Latch:  (Latched with depth)  Audible Swallowing:  (swallows noted)     Comfort (Breast/Nipple):  (pe rmom comfortable and denies sore nipples)  Hold (Positioning):  (mom had latched the baby)      Lactation Tools Discussed/Used Tools: Pump;Flanges Flange Size: 18;21 Breast pump type: Manual Pump Education: Setup, frequency, and cleaning;Milk Storage  Interventions Interventions: Breast feeding basics reviewed;Hand pump;Education;LC Services brochure;CDC milk storage guidelines;CDC Guidelines for Breast Pump Cleaning  Discharge Discharge Education: Engorgement and breast care;Warning signs for feeding baby Pump: Manual;Personal;Hands Free  Consult Status Consult Status: Complete Date: 01/28/24    Deborah Rice 01/28/2024, 10:13 AM

## 2024-01-28 NOTE — Progress Notes (Signed)

## 2024-01-28 NOTE — Progress Notes (Signed)
 POSTPARTUM PROGRESS NOTE  Subjective: Deborah Rice is a 28 y.o. H6E6996 s/p SVD at [redacted]w[redacted]d.  She reports she is doing well. No acute events overnight. She denies any problems with ambulating, voiding or po intake. Denies nausea or vomiting. She has passed flatus. Pain is well controlled.  Lochia is moderate.  Objective: Blood pressure 120/60, pulse 74, temperature 98.3 F (36.8 C), temperature source Oral, resp. rate 16, height 5' 4 (1.626 m), weight 102.1 kg, last menstrual period 03/28/2023, SpO2 100%, unknown if currently breastfeeding.  Physical Exam:  General: alert, cooperative and no distress Chest: no respiratory distress Abdomen: soft, non-tender  Uterine Fundus: firm and at level of umbilicus Extremities: No calf swelling or tenderness  Trace edema  Recent Labs    01/27/24 0033  HGB 12.7  HCT 38.2    Assessment/Plan: Deborah Rice is a 28 y.o. G3P3003 s/p SVD at [redacted]w[redacted]d for LGA.  Routine Postpartum Care: Doing well, pain well-controlled.  -- Continue routine care, lactation support  -- Contraception: IP nexplanon  -- Feeding: breast  Dispo: Plan for discharge 7/4-7/5 per patient preference.  Nicholaus Almarie HERO, MD OB Fellow 01/28/2024 7:04 AM

## 2024-01-28 NOTE — Procedures (Signed)
 Nexplanon  Insertion PROCEDURE NOTE Ms. Deborah Rice is a 28 y.o. [phone] here for Nexplanon  insertion. Last pap smear was on 05/27/2022 and was normal.  No other gynecologic concerns.  Patient was given informed consent, she signed consent form.  Patient does understand that irregular bleeding is a very common side effect of this medication. She was advised to have backup contraception for one week after placement. Pregnancy test in clinic today was negative.  Appropriate time out taken.  Patient's left arm was prepped and draped in the usual sterile fashion.. The ruler used to measure and mark insertion area.  Patient was prepped with alcohol swab and then injected with 3 ml of 1% lidocaine .  She was prepped with betadine, Nexplanon  removed from packaging,  Device confirmed in needle, then inserted full length of needle and withdrawn per handbook instructions. Nexplanon  was able to palpated in the patient's arm; patient palpated the insert herself. There was minimal blood loss.  Patient insertion site covered with guaze and a pressure bandage to reduce any bruising.  The patient tolerated the procedure well and was given post procedure instructions.   Harolyn Cocker 01/28/2024 10:14 AM

## 2024-01-29 LAB — COMPREHENSIVE METABOLIC PANEL WITH GFR
ALT: 13 U/L (ref 0–44)
AST: 19 U/L (ref 15–41)
Albumin: 2.5 g/dL — ABNORMAL LOW (ref 3.5–5.0)
Alkaline Phosphatase: 59 U/L (ref 38–126)
Anion gap: 10 (ref 5–15)
BUN: 7 mg/dL (ref 6–20)
CO2: 21 mmol/L — ABNORMAL LOW (ref 22–32)
Calcium: 9.2 mg/dL (ref 8.9–10.3)
Chloride: 105 mmol/L (ref 98–111)
Creatinine, Ser: 0.68 mg/dL (ref 0.44–1.00)
GFR, Estimated: >60 mL/min (ref >60)
Glucose, Bld: 116 mg/dL — ABNORMAL HIGH (ref 70–99)
Potassium: 3.4 mmol/L — ABNORMAL LOW (ref 3.5–5.1)
Sodium: 136 mmol/L (ref 135–145)
Total Bilirubin: 0.2 mg/dL (ref 0.0–1.2)
Total Protein: 5.5 g/dL — ABNORMAL LOW (ref 6.5–8.1)

## 2024-01-29 LAB — CBC
HCT: 30.4 % — ABNORMAL LOW (ref 36.0–46.0)
Hemoglobin: 10.1 g/dL — ABNORMAL LOW (ref 12.0–15.0)
MCH: 29.6 pg (ref 26.0–34.0)
MCHC: 33.2 g/dL (ref 30.0–36.0)
MCV: 89.1 fL (ref 80.0–100.0)
Platelets: 184 K/uL (ref 150–400)
RBC: 3.41 MIL/uL — ABNORMAL LOW (ref 3.87–5.11)
RDW: 14.2 % (ref 11.5–15.5)
WBC: 10.7 K/uL — ABNORMAL HIGH (ref 4.0–10.5)
nRBC: 0 % (ref 0.0–0.2)

## 2024-01-29 MED ORDER — FERROUS GLUCONATE 324 (38 FE) MG PO TABS: 324.0000 mg | ORAL_TABLET | ORAL | 0 refills | Status: AC

## 2024-01-29 MED ORDER — FERROUS GLUCONATE 324 (38 FE) MG PO TABS: 324.0000 mg | ORAL_TABLET | ORAL | Status: DC

## 2024-01-29 NOTE — Progress Notes (Addendum)
 At 2255 pt given oxyCODONE  (Oxy IR/ROXICODONE ) immediate release tablet 10 mg in order to repeat manual sweep - see MAR.   Awaiting medication to become effective for pain relief in order to proceed with clot removal. Claudene, CNM to come back to bedside.

## 2024-01-29 NOTE — Progress Notes (Signed)
 Deborah  Rice, CNM notified of 3 golf ball clots that pt expelled during peri pad change and void. 1 clot unable to be extracted from toilet due to disintegrating - clots weighing 95 mL.   Pt reports history of retained products requiring MAU visit and D&C following last delivery.   Rice, CNM ordered Methergine  series q4 x 24 hrs alongside repeat CBC and CMP in the morning; en route to visit patient.   [name], RN 01/29/24

## 2024-01-29 NOTE — Progress Notes (Signed)
 RN requested that CNM come assess pt 2/2 passing 3 moderate clots totaling 95 ml.  Sweep of lower uterine segment yielded clots totaling approximately 1000 mL.  Scant active bleeding.  Uterus firm, at umbilicus.  Methergine  series x 24 hours.  Will check CBC later this morning.

## 2024-02-01 ENCOUNTER — Encounter: Admitting: Obstetrics and Gynecology

## 2024-02-08 ENCOUNTER — Telehealth (HOSPITAL_COMMUNITY): Payer: Self-pay | Admitting: *Deleted

## 2024-02-08 NOTE — Telephone Encounter (Signed)
 02/08/2024  Name: Deborah Rice MRN: [id number] DOB: Jun 01, 1996  Reason for Call:  Transition of Care Hospital Discharge Call  Contact Status: Patient Contact Status: Message  Language assistant needed:          Follow-Up Questions:    Van Postnatal Depression Scale:  In the Past 7 Days:    PHQ2-9 Depression Scale:     Discharge Follow-up:    Post-discharge interventions: NA  Mliss Sieve, RN 02/08/2024 15:25

## 2024-03-09 ENCOUNTER — Ambulatory Visit: Admitting: Family Medicine

## 2024-03-17 ENCOUNTER — Ambulatory Visit: Admitting: Cardiology

## 2024-03-24 ENCOUNTER — Encounter: Payer: Self-pay | Admitting: Family Medicine

## 2024-03-24 ENCOUNTER — Ambulatory Visit (INDEPENDENT_AMBULATORY_CARE_PROVIDER_SITE_OTHER): Admitting: Family Medicine

## 2024-03-24 NOTE — Patient Instructions (Signed)
 These supplements and herbs are available over the counter without a prescription. They are often in the vitamin section of a pharmacy.   For Breastfeeding:   NOTHING WILL WORK UNLESS YOU ARE  - Emptying the breast adequately/completely - Emptying the breast regularly  Supplement/Herb Purpose Dose Side Effects  Vitamin D Increase Vitamin D to infant, recommended for all breastfeeding moms and can use instead of supplementing infant 4000 IU daily None  Fenugreek** use with extreme caution as this can decrease milk supply in some especially those with thyroid disorders  Increases prolactin  400mg  TID (max) Nausea, Loose stools and smelling like maple syrup  Goat's Rue Help increase differentiation of breast tissue, good for women with suspected IGT. Helps with insulin sensitvity 1 capsule BID Nausea, loose stools  Legendairy Milk Supplements -PumpPrincess -Liquid Gold -Cash Cow -Milkapalooza  Combination of herbal galactogues  Per packaging Per packaging  Lactation cookies/bars Food based galactogues/increase maternal calories All the time, q2-3 hours    Flax seeds Food based Galactogue Daily GI upset, nausea  Brewer's yeast Food based Galactogue Daily GI upset, nausea  Hemp Hearts Food based Galactogue Daily GI upset, nausea  Oats Food based Galactogue Daily   Lecithin (Soy or Sunflower) Decreases the viscosity of milk, galactogue, fat emulsifier  Good for oversupply mothers to prevent clogged ducts 1200mg  three times per day GI upset  Rehydration drinks (Gatorade/Powerade) Improves maternal hydration and mammary glands are histologically similar to sweat glands  None, caution use in patients with T2DM

## 2024-03-24 NOTE — Progress Notes (Signed)
 Post Partum Visit Note  Deborah Rice is a 28 y.o. G57P3003 female who presents for a postpartum visit. She is 8 weeks postpartum following a normal spontaneous vaginal delivery.  I have fully reviewed the prenatal and intrapartum course. The delivery was at 39w gestational weeks.  Anesthesia: epidural. Postpartum course has been well. Baby is doing well. Baby is feeding by both breast and bottle - Similac. Bleeding no bleeding. Bowel function is normal. Bladder function is normal. Patient is sexually active. Contraception method is Nexplanon . Postpartum depression screening: negative.   The pregnancy intention screening data noted above was reviewed. Potential methods of contraception were discussed. The patient elected to proceed with No data recorded.   Edinburgh Postnatal Depression Scale - 03/24/24 1140       Edinburgh Postnatal Depression Scale:  In the Past 7 Days   I have been able to laugh and see the funny side of things. 0    I have looked forward with enjoyment to things. 1    I have blamed myself unnecessarily when things went wrong. 0    I have been anxious or worried for no good reason. 0    I have felt scared or panicky for no good reason. 2    Things have been getting on top of me. 2    I have been so unhappy that I have had difficulty sleeping. 0    I have felt sad or miserable. 1    I have been so unhappy that I have been crying. 0    The thought of harming myself has occurred to me. 0    Edinburgh Postnatal Depression Scale Total 6          Health Maintenance Due  Topic Date Due   Hepatitis B Vaccines 19-59 Average Risk (1 of 3 - 19+ 3-dose series) Never done   HPV VACCINES (1 - 3-dose SCDM series) Never done   COVID-19 Vaccine (3 - 2024-25 season) 03/28/2023   INFLUENZA VACCINE  02/25/2024    The following portions of the patient's history were reviewed and updated as appropriate: allergies, current medications, past family history, past medical history,  past social history, past surgical history, and problem list.  Review of Systems Pertinent items are noted in HPI.  Objective:  BP 119/81   Pulse 79   Wt 208 lb (94.3 kg)   LMP 03/28/2023   Breastfeeding Yes   BMI 35.70 kg/m    General:  alert, cooperative, and appears stated age   Breasts:  not indicated  Lungs: clear to auscultation bilaterally  Heart:  regular rate and rhythm, S1, S2 normal, no murmur, click, rub or gallop  Abdomen: soft, non-tender; bowel sounds normal; no masses,  no organomegaly   Wound  NA  GU exam:  not indicated       Assessment:   Normal postpartum exam.   Plan:   Essential components of care per ACOG recommendations:  1.  Mood and well being: Patient with negative depression screening today. Reviewed local resources for support.  - Patient tobacco use? No.   - hx of drug use? No.    2. Infant care and feeding:  -Patient currently breastmilk feeding? Yes. Reviewed importance of draining breast regularly to support lactation.  - Noticed decreased BM supply-- she report preivously having 10oz combined volume with DEBP and now getting 6-7 but infant eating 5oz every feed and feels she cannot keep up. She is missing some pumping sessions per her  report while at school. She supplements with 1-2oz of formula. She is latching baby and feels she has at leas 8 breast draining effects.  - Given list of supplements to help supply - message sent to [facility] lactation team -- patient might need resizing of phlanges  -Social determinants of health (SDOH) reviewed in EPIC. No concerns  3. Sexuality, contraception and birth spacing - Patient does not want a pregnancy in the next year.  Desired family size is 3 children.  - Reviewed reproductive life planning. Reviewed contraceptive methods based on pt preferences and effectiveness.  Patient desired Hormonal Implant today.   - Discussed birth spacing of 18 months  4. Sleep and fatigue -Encouraged  family/partner/community support of 4 hrs of uninterrupted sleep to help with mood and fatigue  5. Physical Recovery  - Discussed patients delivery and complications. She describes her labor as good. - Patient had a Vaginal problems after delivery including manual sweeping. Patient had a no laceration. Perineal healing reviewed. Patient expressed understanding - Patient has urinary incontinence? No. - Patient is safe to resume physical and sexual activity  6.  Health Maintenance - HM due items addressed Yes - Last pap smear  Diagnosis  Date Value Ref Range Status  05/27/2022   Final   - Negative for intraepithelial lesion or malignancy (NILM)   Pap smear not done at today's visit.  -Breast Cancer screening indicated? No.   7. Chronic Disease/Pregnancy Condition follow up: None  - PCP follow up  Suzen Maryan Masters, MD [facility] for [facility], [facility]

## 2024-03-28 ENCOUNTER — Ambulatory Visit: Payer: Medicaid Other | Admitting: Dermatology

## 2024-03-28 ENCOUNTER — Encounter: Payer: Self-pay | Admitting: Dermatology

## 2024-03-28 VITALS — BP 158/103 | HR 70

## 2024-03-28 DIAGNOSIS — L83 Acanthosis nigricans: Secondary | ICD-10-CM

## 2024-03-28 DIAGNOSIS — E88819 Insulin resistance, unspecified: Secondary | ICD-10-CM | POA: Diagnosis not present

## 2024-03-28 DIAGNOSIS — B360 Pityriasis versicolor: Secondary | ICD-10-CM | POA: Diagnosis not present

## 2024-03-28 MED ORDER — SAFETY SEAL MISCELLANEOUS MISC: 1.0000 | Freq: Every evening | 8 refills | Status: AC

## 2024-03-28 MED ORDER — KETOCONAZOLE 2 % EX CREA: 1.0000 | TOPICAL_CREAM | Freq: Two times a day (BID) | CUTANEOUS | 5 refills | Status: AC

## 2024-03-28 NOTE — Patient Instructions (Addendum)
 Date: Tue Mar 28 2024  Hello Deborah Rice,  Thank you for visiting today. Here is a summary of the key instructions:  - Medications:   - Apply ketoconazole  cream to affected areas twice a day for one month or until the rash is gone   - Continue using glycolic acid for acanthosis nigricans   - After breastfeeding ends, apply MedRock Lightening Cream daily until the darkenss improves, then twice a week for maintenance  - Skin Care:   - Do not use Medrol cream while breastfeeding  - Other Instructions:   - Stay active   - Be mindful of snacking habits  - Follow-up:   - Next appointment in April or May  Please reach out if you have any questions or concerns.  Warm regards,  Dr. [facility]     Important Information  Due to recent changes in healthcare laws, you may see results of your pathology and/or laboratory studies on MyChart before the doctors have had a chance to review them. We understand that in some cases there may be results that are confusing or concerning to you. Please understand that not all results are received at the same time and often the doctors may need to interpret multiple results in order to provide you with the best plan of care or course of treatment. Therefore, we ask that you please give us  2 business days to thoroughly review all your results before contacting the office for clarification. Should we see a critical lab result, you will be contacted sooner.   If You Need Anything After Your Visit  If you have any questions or concerns for your doctor, please call our main line at [phone] If no one answers, please leave a voicemail as directed and we will return your call as soon as possible. Messages left after 4 pm will be answered the following business day.   You may also send us  a message via MyChart. We typically respond to MyChart messages within 1-2 business days.  For prescription refills, please ask your pharmacy to contact our  office. Our fax number is [phone].  If you have an urgent issue when the clinic is closed that cannot wait until the next business day, you can page your doctor at the number below.    Please note that while we do our best to be available for urgent issues outside of office hours, we are not available 24/7.   If you have an urgent issue and are unable to reach us , you may choose to seek medical care at your doctor's office, retail clinic, urgent care center, or emergency room.  If you have a medical emergency, please immediately call 911 or go to the emergency department. In the event of inclement weather, please call our main line at [phone] for an update on the status of any delays or closures.  Dermatology Medication Tips: Please keep the boxes that topical medications come in in order to help keep track of the instructions about where and how to use these. Pharmacies typically print the medication instructions only on the boxes and not directly on the medication tubes.   If your medication is too expensive, please contact our office at [phone] or send us  a message through MyChart.   We are unable to tell what your co-pay for medications will be in advance as this is different depending on your insurance coverage. However, we may be able to find a substitute medication at lower cost or fill out paperwork  to get insurance to cover a needed medication.   If a prior authorization is required to get your medication covered by your insurance company, please allow us  1-2 business days to complete this process.  Drug prices often vary depending on where the prescription is filled and some pharmacies may offer cheaper prices.  The website www.goodrx.com contains coupons for medications through different pharmacies. The prices here do not account for what the cost may be with help from insurance (it may be cheaper with your insurance), but the website can give you the price if you did  not use any insurance.  - You can print the associated coupon and take it with your prescription to the pharmacy.  - You may also stop by our office during regular business hours and pick up a GoodRx coupon card.  - If you need your prescription sent electronically to a different pharmacy, notify our office through [facility] or by phone at [phone]

## 2024-03-28 NOTE — Progress Notes (Signed)
   Follow-Up Visit   Subjective  Deborah Rice is a 28 y.o. female who presents for the following: Dermatitis & Acanthosis Nigracans  Patient present today for follow up visit for Dermatitis & Acanthosis Nigracans. Patient was last evaluated on 09/23/23. Patient was prescribed clotrimazole  betamethasone  back in October 2024. She reports she has been using it for a month now. Patient reports sxs are improving. Patient denies medication changes.  The following portions of the chart were reviewed this encounter and updated as appropriate: medications, allergies, medical history  Review of Systems:  No other skin or systemic complaints except as noted in HPI or Assessment and Plan.  Objective  Well appearing patient in no apparent distress; mood and affect are within normal limits.    A focused examination was performed of the following areas: elbows and neck  Relevant exam findings are noted in the Assessment and Plan.                  Assessment & Plan   1. Tinea Versicolor - Assessment: Patient presents with white patches on neck consistent with tinea versicolor caused by yeast overgrowth. Previous clotrimazole -betamethasone  treatment was effective but betamethasone  too potent for neck area due to risk of skin thinning and stretch marks. Condition triggered by hormonal changes and increased oil production. - Plan:    Discontinue clotrimazole -betamethasone     Start ketoconazole  cream twice daily for one month or until resolution    Patient education provided on etiology and expected treatment course    Advise that pigment may take time to return to normal  2. Acanthosis Nigricans with Insulin Resistance - Assessment: Patient reports improvement with glycolic acid treatment for acanthosis nigricans. Condition associated with insulin resistance which can manifest in skin changes before laboratory abnormalities detected. Recent A1C 5.2 within normal limits but trending upward.  Currently breastfeeding which impacts treatment options. - Plan:    Continue glycolic acid treatment    Prescribe MedRock lightening cream cream for future use after breastfeeding completed    Apply glycolic acid daily until resolution, then twice weekly for prevention    Counsel on importance of maintaining healthy lifestyle to manage insulin resistance    Monitor A1C levels at future visits  Follow-up as needed to assess response to ketoconazole  treatment and acanthosis nigricans management. TINEA VERSICOLOR   Related Medications ketoconazole  (NIZORAL ) 2 % cream Apply 1 Application topically 2 (two) times daily. ACANTHOSIS NIGRICANS   Related Medications Safety Seal Miscellaneous MISC Apply 1 Application topically at bedtime. Medication Name: melaxemic cream (tranexamic acid 5% kojic acid usp 2% vit c usp 0.025% hyaluronic acid excp 0.1%)  Return in about 8 months (around 11/25/2024) for Tinea Versicolor follow up.  I, Doyce Pan, CMA, am acting as scribe for [employer], DO.  Documentation: I have reviewed the above documentation for accuracy and completeness, and I agree with the above.  Delon Lenis, DO

## 2024-04-06 ENCOUNTER — Telehealth: Payer: Self-pay

## 2024-04-06 DIAGNOSIS — F339 Major depressive disorder, recurrent, unspecified: Secondary | ICD-10-CM

## 2024-04-06 NOTE — Progress Notes (Signed)
 Complex Care Management Note Care Guide Note  04/06/2024 Name: Kellin Fifer MRN: [id number] DOB: 10/24/1995   Complex Care Management Outreach Attempts: An unsuccessful telephone outreach was attempted today to offer the patient information about available complex care management services.  Follow Up Plan:  Additional outreach attempts will be made to offer the patient complex care management information and services.   Encounter Outcome:  No Answer  Dreama [facility]  [facility], [facility] VBCI Assistant Direct Dial: [phone]  Fax: [phone]

## 2024-04-11 NOTE — Progress Notes (Unsigned)
 Complex Care Management Note Care Guide Note  04/11/2024 Name: Deborah Rice MRN: [id number] DOB: 08-18-1995   Complex Care Management Outreach Attempts: A second unsuccessful outreach was attempted today to offer the patient with information about available complex care management services.  Follow Up Plan:  Additional outreach attempts will be made to offer the patient complex care management information and services.   Encounter Outcome:  No Answer  Dreama [facility]wood [facility]  [facility], [facility] VBCI Assistant Direct Dial: [phone]  Fax: [phone]

## 2024-04-14 NOTE — Progress Notes (Signed)
 Complex Care Management Note Care Guide Note  04/14/2024 Name: Deborah Rice MRN: [id number] DOB: 04/14/1996   Complex Care Management Outreach Attempts: A third unsuccessful outreach was attempted today to offer the patient with information about available complex care management services.  Follow Up Plan:  No further outreach attempts will be made at this time. We have been unable to contact the patient to offer or enroll patient in complex care management services.  Encounter Outcome:  No Answer  Dreama [facility]wood [facility]  [facility], [facility] VBCI Assistant Direct Dial: [phone]  Fax: [phone]

## 2024-04-27 ENCOUNTER — Ambulatory Visit (INDEPENDENT_AMBULATORY_CARE_PROVIDER_SITE_OTHER): Admitting: *Deleted

## 2024-04-27 DIAGNOSIS — Z23 Encounter for immunization: Secondary | ICD-10-CM | POA: Diagnosis present

## 2024-04-28 NOTE — Progress Notes (Signed)
 Flu given.  No issues. Harlene Carte, CMA

## 2024-09-05 ENCOUNTER — Encounter: Payer: Self-pay | Admitting: Family Medicine

## 2024-12-05 ENCOUNTER — Ambulatory Visit: Admitting: Dermatology
